# Patient Record
Sex: Female | Born: 1965
Health system: Southern US, Community
[De-identification: ages and names within clinical notes are randomized; demographics above are authoritative.]

## PROBLEM LIST (undated history)

## (undated) DIAGNOSIS — N3010 Interstitial cystitis (chronic) without hematuria: Secondary | ICD-10-CM

---

## 2006-04-17 ENCOUNTER — Emergency Department (HOSPITAL_COMMUNITY): Admission: EM | Admit: 2006-04-17 | Discharge: 2006-04-18 | Payer: Self-pay | Admitting: Emergency Medicine

## 2007-12-14 IMAGING — CR DG CHEST 2V
2 series · 2 of 2 positions shown · non-contrast
Comparison: none

CLINICAL DATA: Chest pain and shortness of breath.  Smoker.
 CHEST - 2 VIEW:

[w chest pa]
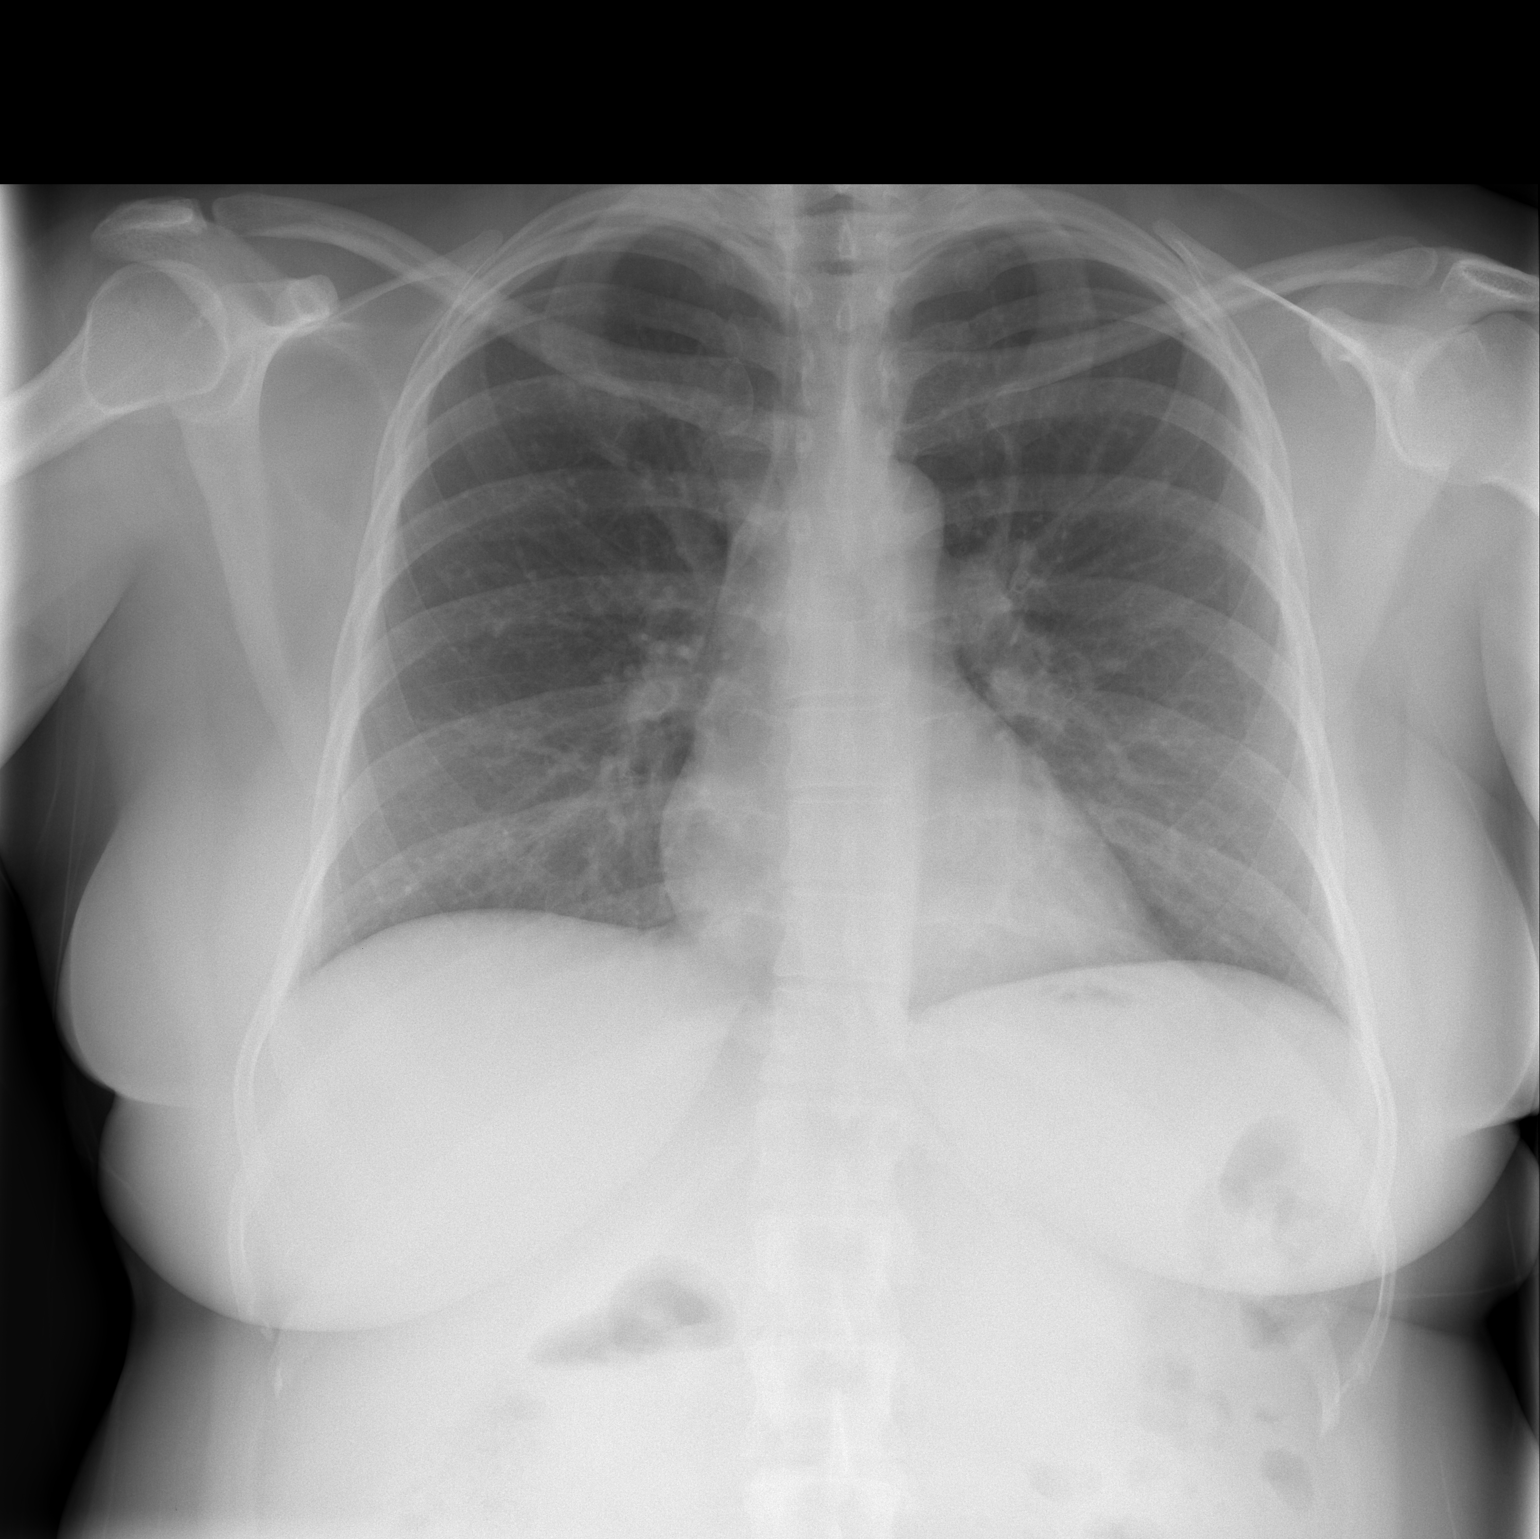

[w chest lat]
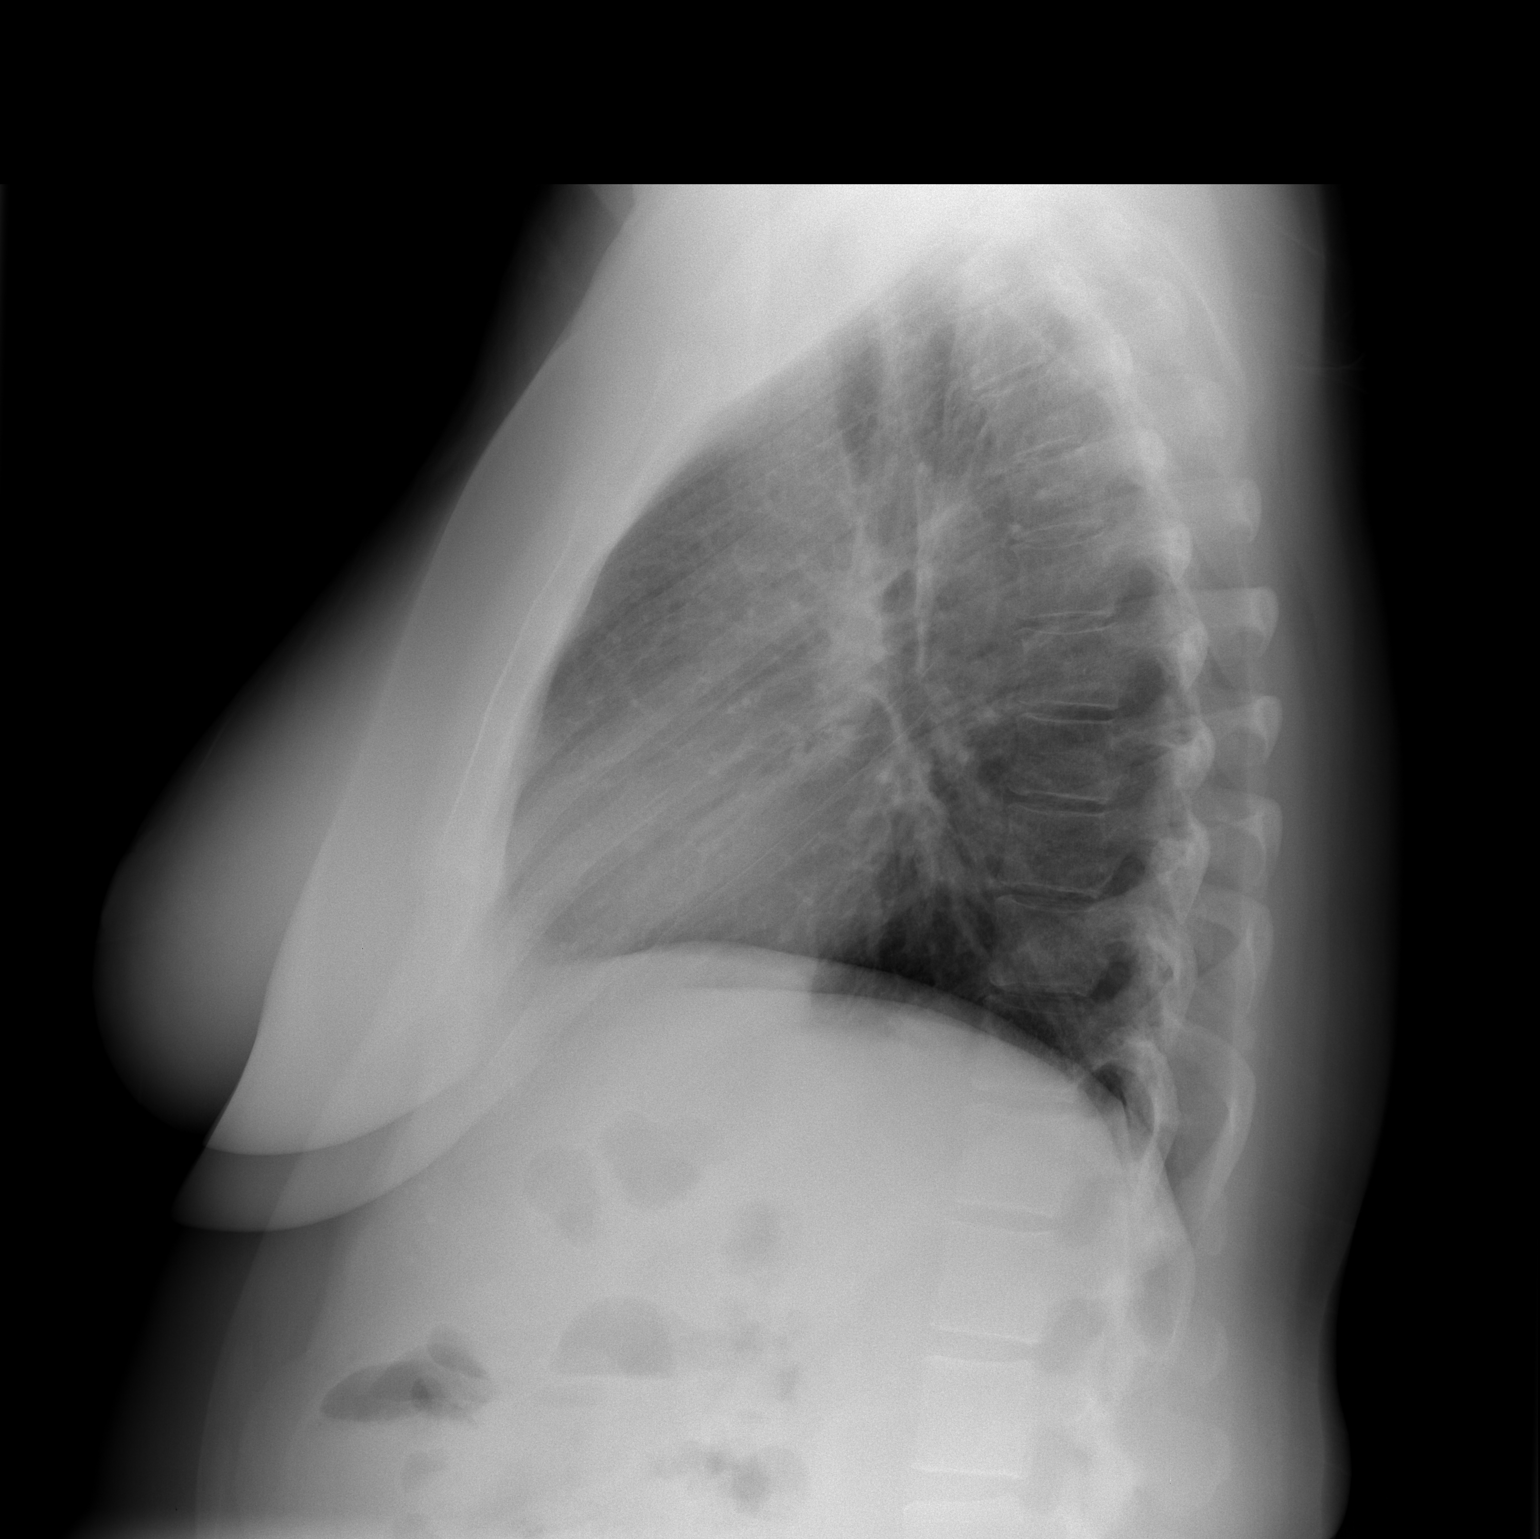

[2 of 2 positions shown; findings below may reference images not displayed]

FINDINGS: The heart size and mediastinal contours are within normal limits.  Both lungs are clear.  The visualized skeletal structures are unremarkable.
IMPRESSION: No active cardiopulmonary disease.

## 2010-01-22 ENCOUNTER — Ambulatory Visit: Payer: Self-pay | Admitting: Emergency Medicine

## 2010-01-22 DIAGNOSIS — N390 Urinary tract infection, site not specified: Secondary | ICD-10-CM

## 2010-01-22 LAB — CONVERTED CEMR LAB
Nitrite: POSITIVE
Protein, U semiquant: 30
Urobilinogen, UA: 0.2
pH: 5.5

## 2010-05-29 ENCOUNTER — Ambulatory Visit: Payer: Self-pay | Admitting: Family Medicine

## 2010-05-29 DIAGNOSIS — L259 Unspecified contact dermatitis, unspecified cause: Secondary | ICD-10-CM

## 2010-05-29 LAB — CONVERTED CEMR LAB
Bilirubin Urine: NEGATIVE
Blood in Urine, dipstick: 2
Urobilinogen, UA: 0.2
pH: 6.5

## 2010-06-01 ENCOUNTER — Telehealth (INDEPENDENT_AMBULATORY_CARE_PROVIDER_SITE_OTHER): Payer: Self-pay | Admitting: *Deleted

## 2010-07-21 NOTE — Assessment & Plan Note (Signed)
Summary: POSSIBLE UTI? NH (rm 3)   Vital Signs:  Patient Profile:   45 Years Old Female CC:      possible UTI Height:     63.5 inches Weight:      175 pounds O2 Sat:      97 % O2 treatment:    Room Air Temp:     99.2 degrees F oral Pulse rate:   83 / minute Resp:     16 per minute BP sitting:   125 / 81  (left arm) Cuff size:   regular  Vitals Entered By: Clemens Catholic LPN (May 29, 2010 11:13 AM)                  Updated Prior Medication List: FISH OIL MAXIMUM STRENGTH 1200 MG CAPS (OMEGA-3 FATTY ACIDS) once daily * VITMAIN C once daily B COMPLEX FORMULA 1  TABS (VITAMINS-LIPOTROPICS) once daily VITAMIN E 16109 UNIT/ML OIL (VITAMIN E) once daily CO Q 10 60 MG CAPS (COENZYME Q10) once daily AZO-STANDARD 95 MG TABS (PHENAZOPYRIDINE HCL)   Current Allergies: ! MORPHINE ! TETRACYCLINE ! * LATEXHistory of Present Illness Chief Complaint: possible UTI History of Present Illness:  Subjective:  Patient complains of awakening this morning 4:30am with dysuria, urgency, frequency.  ? low grade fever.  No abdominal pain.  No nausea/vomiting.  No vaginal discharge or pelvic pain. She also has a history of eczema and requests a refill of triamcinolone ointment for recurrent rash on her hands.  REVIEW OF SYSTEMS Constitutional Symptoms       Complains of fever, chills, and night sweats.     Denies weight loss, weight gain, and fatigue.  Eyes       Denies change in vision, eye pain, eye discharge, glasses, contact lenses, and eye surgery. Ear/Nose/Throat/Mouth       Denies hearing loss/aids, change in hearing, ear pain, ear discharge, dizziness, frequent runny nose, frequent nose bleeds, sinus problems, sore throat, hoarseness, and tooth pain or bleeding.  Respiratory       Denies dry cough, productive cough, wheezing, shortness of breath, asthma, bronchitis, and emphysema/COPD.  Cardiovascular       Denies murmurs, chest pain, and tires easily with exhertion.     Gastrointestinal       Denies stomach pain, nausea/vomiting, diarrhea, constipation, blood in bowel movements, and indigestion. Genitourniary       Complains of painful urination and blood or discharge from vagina.      Denies kidney stones and loss of urinary control.      Comments: milky vaginal d/c Neurological       Denies paralysis, seizures, and fainting/blackouts. Musculoskeletal       Denies muscle pain, joint pain, joint stiffness, decreased range of motion, redness, swelling, muscle weakness, and gout.  Skin       Denies bruising, unusual mles/lumps or sores, and hair/skin or nail changes.  Psych       Denies mood changes, temper/anger issues, anxiety/stress, speech problems, depression, and sleep problems. Other Comments: pt c/o painful urination, lower abd pain, fever, chills x this AM. pt states she took AZO this AM. she also reports a milky vaginal d/c she used an OTC suppository yesterday.    Past History:  Past Medical History: Reviewed history from 01/22/2010 and no changes required. Unremarkable  Past Surgical History: Reviewed history from 01/22/2010 and no changes required. discemtomy x 2 Hysterectomy  Bladder tack lasik  Family History: Reviewed history from 01/22/2010 and no changes  required. Family History Hypertension Family History High cholesterol DM  Social History: Reviewed history from 01/22/2010 and no changes required. Current Smoker 1/2 PPD Alcohol use-yes- occasioanlly Drug use-no   Objective:  No acute distress  Eyes:  Pupils are equal, round, and reactive to light and accomdation.  Extraocular movement is intact.  Conjunctivae are not inflamed.  Mouth:  moist mucous membranes  Neck:  Supple.  No adenopathy is present.  No thyromegaly is present  Lungs:  Clear to auscultation.  Breath sounds are equal.  Heart:  Regular rate and rhythm without murmurs, rubs, or gallops.  Abdomen:  Nontender without masses or hepatosplenomegaly.   Bowel sounds are present.  No CVA or flank tenderness.  Skin:  eczematous appearing patches on hands and forearms. urinalysis (dipstick): leuks 2+; positive nitrite, 2+ blood Assessment  Assessed UTI as deteriorated - Donna Christen MD New Problems: ECZEMA, HANDS (ICD-692.9)   Plan New Medications/Changes: TRIAMCINOLONE ACETONIDE 0.1 % OINT (TRIAMCINOLONE ACETONIDE) Apply to affected area two times a day to three times a day  #30gm x 1, 05/29/2010, Donna Christen MD FLUCONAZOLE 150 MG TABS (FLUCONAZOLE) One by mouth  #1 x 0, 05/29/2010, Donna Christen MD CIPROFLOXACIN HCL 250 MG TABS (CIPROFLOXACIN HCL) 1 bo two times a day  #20 x 0, 05/29/2010, Donna Christen MD  New Orders: Est. Patient Level III [78469] T-Urinalysis Dipstick only [81003QW] Planning Comments:   Patient declines urine culture. Resume Cipro for 3 days with OTC AZO.  Increase fluid intake.  May prophylactically continue a Cipro tab every sexual intercourse Follow-up with urologist if symptoms become more frequent or worsen Rx given for triamcinolone ointment                                                                                             The patient and/or caregiver has been counseled thoroughly with regard to medications prescribed including dosage, schedule, interactions, rationale for use, and possible side effects and they verbalize understanding.  Diagnoses and expected course of recovery discussed and will return if not improved as expected or if the condition worsens. Patient and/or caregiver verbalized understanding.  Prescriptions: TRIAMCINOLONE ACETONIDE 0.1 % OINT (TRIAMCINOLONE ACETONIDE) Apply to affected area two times a day to three times a day  #30gm x 1   Entered and Authorized by:   Donna Christen MD   Signed by:   Donna Christen MD on 05/29/2010   Method used:   Print then Give to Patient   RxID:   [id number] FLUCONAZOLE 150 MG TABS (FLUCONAZOLE) One by mouth  #1 x 0   Entered and  Authorized by:   Donna Christen MD   Signed by:   Donna Christen MD on 05/29/2010   Method used:   Print then Give to Patient   RxID:   [id number] CIPROFLOXACIN HCL 250 MG TABS (CIPROFLOXACIN HCL) 1 bo two times a day  #20 x 0   Entered and Authorized by:   Donna Christen MD   Signed by:   Donna Christen MD on 05/29/2010   Method used:   Print then Give to Patient   RxID:  [id number]   Orders Added: 1)  Est. Patient Level III [99213] 2)  T-Urinalysis Dipstick only [81003QW]    Laboratory Results   Urine Tests  Date/Time Received: May 29, 2010 10:15 AM  Date/Time Reported: May 29, 2010 10:16 AM   Routine Urinalysis   Color: orange Appearance: Clear Glucose: negative   (Normal Range: Negative) Bilirubin: negative   (Normal Range: Negative) Ketone: negative   (Normal Range: Negative) Spec. Gravity: 1.015   (Normal Range: 1.003-1.035) Blood: 2 +   (Normal Range: Negative) pH: 6.5   (Normal Range: 5.0-8.0) Protein: negative   (Normal Range: Negative) Urobilinogen: 0.2   (Normal Range: 0-1) Nitrite: positive   (Normal Range: Negative) Leukocyte Esterace: 2+   (Normal Range: Negative)    Comments: pt is taking AZO OTC

## 2010-07-21 NOTE — Assessment & Plan Note (Signed)
Summary: UTI   Vital Signs:  Patient Profile:   45 Years Old Female CC:      UTI Height:     63.5 inches Weight:      168 pounds O2 Sat:      98 % O2 treatment:    Room Air Temp:     98.0 degrees F oral Pulse rate:   61 / minute Resp:     14 per minute BP sitting:   117 / 85  (right arm) Cuff size:   regular  Vitals Entered By: Lajean Saver RN (January 22, 2010 12:56 PM)                  Updated Prior Medication List: FISH OIL MAXIMUM STRENGTH 1200 MG CAPS (OMEGA-3 FATTY ACIDS) once daily * VITMAIN C once daily B COMPLEX FORMULA 1  TABS (VITAMINS-LIPOTROPICS) once daily VITAMIN E 52841 UNIT/ML OIL (VITAMIN E) once daily CO Q 10 60 MG CAPS (COENZYME Q10) once daily  Current Allergies: ! MORPHINE ! TETRACYCLINE ! * LATEXHistory of Present Illness Chief Complaint: UTI History of Present Illness: UTI symptoms for a few days.  It happens every time she & her boyfriend have sex, which is about once every few months.  She is concerned because it always happens.  She has been using Azo & cranberry which helps a little.  No fever, chills, N/V.  Sexually active with 1 female. +Dysuria, frequency, urgency.  REVIEW OF SYSTEMS Constitutional Symptoms      Denies fever, chills, night sweats, weight loss, weight gain, and fatigue.  Eyes       Denies change in vision, eye pain, eye discharge, glasses, contact lenses, and eye surgery. Ear/Nose/Throat/Mouth       Denies hearing loss/aids, change in hearing, ear pain, ear discharge, dizziness, frequent runny nose, frequent nose bleeds, sinus problems, sore throat, hoarseness, and tooth pain or bleeding.  Respiratory       Denies dry cough, productive cough, wheezing, shortness of breath, asthma, bronchitis, and emphysema/COPD.  Cardiovascular       Denies murmurs, chest pain, and tires easily with exhertion.    Gastrointestinal       Complains of nausea/vomiting.      Denies stomach pain, diarrhea, constipation, blood in bowel  movements, and indigestion.      Comments: nausea Genitourniary       Complains of painful urination.      Denies kidney stones and loss of urinary control.      Comments: hematuria Neurological       Denies paralysis, seizures, and fainting/blackouts. Musculoskeletal       Denies muscle pain, joint pain, joint stiffness, decreased range of motion, redness, swelling, muscle weakness, and gout.  Skin       Denies bruising, unusual mles/lumps or sores, and hair/skin or nail changes.  Psych       Denies mood changes, temper/anger issues, anxiety/stress, speech problems, depression, and sleep problems. Other Comments: taken Azo 2 days ago   Past History:  Past Medical History: Unremarkable  Past Surgical History: discemtomy x 2 Hysterectomy  Bladder tack lasik  Family History: Family History Hypertension Family History High cholesterol DM  Social History: Current Smoker 1/2 PPD Alcohol use-yes- occasioanlly Drug use-no Smoking Status:  current Drug Use:  no Physical Exam General appearance: well developed, well nourished, no acute distress Ears: normal, no lesions or deformities Nasal: mucosa pink, nonedematous, no septal deviation, turbinates normal Oral/Pharynx: tongue normal, posterior pharynx without erythema  or exudate Heart: regular rate and  rhythm, no murmur Abdomen: soft, non-tender without obvious organomegaly Back: no CAT Skin: no obvious rashes or lesions Assessment New Problems: UTI (ICD-599.0)   Patient Education: Hydration, cranberry juice, urinate after intercourse  Plan New Medications/Changes: CIPRO 250 MG TABS (CIPROFLOXACIN HCL) 1 tab by mouth two times a day for 10 days  #20 x 0, 01/22/2010, Hoyt Koch MD FLUCONAZOLE 150 MG TABS (FLUCONAZOLE) 1 tab by mouth x1 dose, may repeat in 2-3 days if symptoms continue  #2 x 0, 01/22/2010, Hoyt Koch MD  New Orders: New Patient Level III [phone] UA Dipstick w/o Micro (automated)   [81003] Planning Comments:   Gave Ph # of PCP to call May need prophylactic ABX due to recurrent symptoms or referral to urology She will wait to see how this treatment goes Diflucan for h/o yeast vaginitis w/ ABX May use Azo x2 days  Follow Up: Follow up with Primary Physician  The patient and/or caregiver has been counseled thoroughly with regard to medications prescribed including dosage, schedule, interactions, rationale for use, and possible side effects and they verbalize understanding.  Diagnoses and expected course of recovery discussed and will return if not improved as expected or if the condition worsens. Patient and/or caregiver verbalized understanding.  Prescriptions: CIPRO 250 MG TABS (CIPROFLOXACIN HCL) 1 tab by mouth two times a day for 10 days  #20 x 0   Entered and Authorized by:   Hoyt Koch MD   Signed by:   Hoyt Koch MD on 01/22/2010   Method used:   Print then Give to Patient   RxID:   [id number] FLUCONAZOLE 150 MG TABS (FLUCONAZOLE) 1 tab by mouth x1 dose, may repeat in 2-3 days if symptoms continue  #2 x 0   Entered and Authorized by:   Hoyt Koch MD   Signed by:   Hoyt Koch MD on 01/22/2010   Method used:   Print then Give to Patient   RxID:   [id number]   Orders Added: 1)  New Patient Level III [57322] 2)  UA Dipstick w/o Micro (automated)  [81003]  Laboratory Results   Urine Tests  Date/Time Received: January 22, 2010 12:57 PM  Date/Time Reported: January 22, 2010 12:57 PM   Routine Urinalysis   Color: orange Appearance: Clear Glucose: 100   (Normal Range: Negative) Bilirubin: negative   (Normal Range: Negative) Ketone: negative   (Normal Range: Negative) Spec. Gravity: <1.005   (Normal Range: 1.003-1.035) Blood: large   (Normal Range: Negative) pH: 5.5   (Normal Range: 5.0-8.0) Protein: 30   (Normal Range: Negative) Urobilinogen: 0.2   (Normal Range: 0-1) Nitrite: positive   (Normal Range:  Negative) Leukocyte Esterace: small   (Normal Range: Negative)

## 2010-07-23 NOTE — Progress Notes (Signed)
  Phone Note Outgoing Call Call back at [facility] Phone [phone]   Call placed by: Emilio Math,  June 01, 2010 9:34 AM Call placed to: Patient Summary of Call: Could not leave a message

## 2016-07-02 DIAGNOSIS — N3010 Interstitial cystitis (chronic) without hematuria: Secondary | ICD-10-CM | POA: Diagnosis not present

## 2016-07-02 DIAGNOSIS — R319 Hematuria, unspecified: Secondary | ICD-10-CM | POA: Diagnosis not present

## 2016-07-02 DIAGNOSIS — N390 Urinary tract infection, site not specified: Secondary | ICD-10-CM | POA: Diagnosis not present

## 2016-08-24 DIAGNOSIS — N390 Urinary tract infection, site not specified: Secondary | ICD-10-CM | POA: Diagnosis not present

## 2017-11-14 ENCOUNTER — Emergency Department
Admission: EM | Admit: 2017-11-14 | Discharge: 2017-11-14 | Disposition: A | Discharge status: Home / Self Care | Payer: BLUE CROSS/BLUE SHIELD | Source: Home / Self Care | Attending: Family Medicine | Admitting: Family Medicine

## 2017-11-14 ENCOUNTER — Other Ambulatory Visit: Payer: Self-pay

## 2017-11-14 DIAGNOSIS — R300 Dysuria: Secondary | ICD-10-CM

## 2017-11-14 DIAGNOSIS — R310 Gross hematuria: Secondary | ICD-10-CM

## 2017-11-14 DIAGNOSIS — N3010 Interstitial cystitis (chronic) without hematuria: Secondary | ICD-10-CM

## 2017-11-14 HISTORY — DX: Interstitial cystitis (chronic) without hematuria: N30.10

## 2017-11-14 LAB — POCT URINALYSIS DIP (MANUAL ENTRY)

## 2017-11-14 MED ORDER — CEPHALEXIN 500 MG PO CAPS: 500.0000 mg | ORAL_CAPSULE | Freq: Two times a day (BID) | ORAL | 0 refills | Status: DC

## 2017-11-14 MED ORDER — OXYBUTYNIN CHLORIDE ER 10 MG PO TB24: 10.0000 mg | ORAL_TABLET | Freq: Every day | ORAL | 0 refills | Status: DC

## 2017-11-14 NOTE — ED Triage Notes (Signed)
Pt has a hx of interstitial cystitis.  She started Friday with genital irritation.  Saturday had pressure and pain, and took monistat and AZO.  Yesterday burning with urination, took Diflucan, and AZo.  This am feels sharp burning pain, and blood in urine.

## 2017-11-14 NOTE — ED Provider Notes (Signed)
[facility]    CSN: [id number] Arrival date & time: 11/14/17  0944     History   Chief Complaint Chief Complaint  Patient presents with  . Urinary Frequency    HPI Vanessa Landry is a 52 y.o. female.   HPI  Vanessa Landry is a 52 y.o. female presenting to UC with c/o 2-3 days of worsening dysuria, urinary frequency, and bladder/pain.  She does have a hx of interstitial cystitis and is usually followed by her PCP but states due to the holiday weekend she was unable to see them today and the pain has become too irritative for pt to wait to f/u with PCP.  She has taken Azo w/o relief.  Denies fever, chills, n/v/d. Pain feels similar to prior flares of her IC.  She notes she typically does well with the oxybutynin and an antibiotic.    Past Medical History:  Diagnosis Date  . Interstitial cystitis     Patient Active Problem List   Diagnosis Date Noted  . ECZEMA, HANDS 05/29/2010  . UTI 01/22/2010    History reviewed. No pertinent surgical history.  OB History   None      Home Medications    Prior to Admission medications   Medication Sig Start Date End Date Taking? Authorizing Provider  cephALEXin (KEFLEX) 500 MG capsule Take 1 capsule (500 mg total) by mouth 2 (two) times daily. 11/14/17   Lurene Shadow, PA-C  oxybutynin (DITROPAN XL) 10 MG 24 hr tablet Take 1 tablet (10 mg total) by mouth at bedtime. 11/14/17   Lurene Shadow, PA-C    Family History History reviewed. No pertinent family history.  Social History Social History   Tobacco Use  . Smoking status: Not on file  Substance Use Topics  . Alcohol use: Not on file  . Drug use: Not on file     Allergies   Latex; Morphine; and Tetracycline   Review of Systems Review of Systems  Constitutional: Negative for chills and fever.  Gastrointestinal: Positive for abdominal pain ( lower bladder pressure). Negative for nausea and vomiting.  Genitourinary: Positive for dysuria, frequency,  hematuria and urgency. Negative for decreased urine volume and pelvic pain.  Musculoskeletal: Negative for back pain and myalgias.     Physical Exam Triage Vital Signs ED Triage Vitals [11/14/17 1023]  Enc Vitals Group     BP 134/88     Pulse Rate 79     Resp      Temp (!) 97.4 F (36.3 C)     Temp Source Oral     SpO2 95 %     Weight 201 lb (91.2 kg)     Height  (1.626 m)     Head Circumference      Peak Flow      Pain Score 8     Pain Loc      Pain Edu?      Excl. in GC?    No data found.  Updated Vital Signs BP 134/88 (BP Location: Right Arm)   Pulse 79   Temp (!) 97.4 F (36.3 C) (Oral)   Ht  (1.626 m)   Wt 201 lb (91.2 kg)   SpO2 95%   BMI 34.50 kg/m   Visual Acuity Right Eye Distance:   Left Eye Distance:   Bilateral Distance:    Right Eye Near:   Left Eye Near:    Bilateral Near:     Physical Exam  Constitutional:  She is oriented to person, place, and time. She appears well-developed and well-nourished. No distress.  HENT:  Head: Normocephalic and atraumatic.  Mouth/Throat: Oropharynx is clear and moist.  Eyes: EOM are normal.  Neck: Normal range of motion. Neck supple.  Cardiovascular: Normal rate and regular rhythm.  Pulmonary/Chest: Effort normal and breath sounds normal. No stridor. No respiratory distress. She has no wheezes. She has no rales.  Abdominal: Soft. She exhibits no distension. There is tenderness ( mild suprapubic tenderness). There is no CVA tenderness.  Musculoskeletal: Normal range of motion.  Neurological: She is alert and oriented to person, place, and time.  Skin: Skin is warm and dry. She is not diaphoretic.  Psychiatric: She has a normal mood and affect. Her behavior is normal.  Nursing note and vitals reviewed.    UC Treatments / Results  Labs (all labs ordered are listed, but only abnormal results are displayed) Labs Reviewed  URINE CULTURE  POCT URINALYSIS DIP (MANUAL ENTRY)     EKG None  Radiology No results found.  Procedures Procedures (including critical care time)  Medications Ordered in UC Medications - No data to display  Initial Impression / Assessment and Plan / UC Course  I have reviewed the triage vital signs and the nursing notes.  Pertinent labs & imaging results that were available during my care of the patient were reviewed by me and considered in my medical decision making (see chart for details).    Unable to perform UA because pt took Azo today. Urine culture sent Medical records reviewed.  Will start on Keflex as it seems she has done well with this in the past.   Final Clinical Impressions(s) / UC Diagnoses   Final diagnoses:  Dysuria  Interstitial cystitis  Hematuria, gross   Discharge Instructions   None    ED Prescriptions    Medication Sig Dispense Auth. Provider   cephALEXin (KEFLEX) 500 MG capsule Take 1 capsule (500 mg total) by mouth 2 (two) times daily. 14 capsule Doroteo Glassman, Isair Inabinet O, PA-C   oxybutynin (DITROPAN XL) 10 MG 24 hr tablet Take 1 tablet (10 mg total) by mouth at bedtime. 5 tablet Lurene Shadow, PA-C     Controlled Substance Prescriptions [place] Controlled Substance Registry consulted? Not Applicable   Rolla Plate 11/14/17 1308

## 2017-11-15 LAB — URINE CULTURE
MICRO NUMBER:: 90639619
Result:: NO GROWTH
SPECIMEN QUALITY:: ADEQUATE

## 2017-11-16 ENCOUNTER — Telehealth: Payer: Self-pay | Admitting: Emergency Medicine

## 2017-12-05 ENCOUNTER — Emergency Department (HOSPITAL_COMMUNITY)
Admission: EM | Admit: 2017-12-05 | Discharge: 2017-12-05 | Disposition: A | Discharge status: Home / Self Care | Payer: BLUE CROSS/BLUE SHIELD | Attending: Emergency Medicine | Admitting: Emergency Medicine

## 2017-12-05 ENCOUNTER — Encounter (HOSPITAL_COMMUNITY): Payer: Self-pay | Admitting: *Deleted

## 2017-12-05 ENCOUNTER — Emergency Department (HOSPITAL_COMMUNITY): Payer: BLUE CROSS/BLUE SHIELD

## 2017-12-05 DIAGNOSIS — R079 Chest pain, unspecified: Secondary | ICD-10-CM | POA: Diagnosis not present

## 2017-12-05 DIAGNOSIS — Z79899 Other long term (current) drug therapy: Secondary | ICD-10-CM | POA: Insufficient documentation

## 2017-12-05 DIAGNOSIS — Z87891 Personal history of nicotine dependence: Secondary | ICD-10-CM | POA: Diagnosis not present

## 2017-12-05 DIAGNOSIS — R002 Palpitations: Secondary | ICD-10-CM | POA: Diagnosis not present

## 2017-12-05 DIAGNOSIS — Z91040 Latex allergy status: Secondary | ICD-10-CM | POA: Diagnosis not present

## 2017-12-05 DIAGNOSIS — R0789 Other chest pain: Secondary | ICD-10-CM

## 2017-12-05 LAB — CBC
HCT: 46.3 % — ABNORMAL HIGH (ref 36.0–46.0)
HEMOGLOBIN: 14.8 g/dL (ref 12.0–15.0)
MCH: 28.1 pg (ref 26.0–34.0)
MCHC: 32 g/dL (ref 30.0–36.0)
MCV: 88 fL (ref 78.0–100.0)
Platelets: 310 10*3/uL (ref 150–400)
RBC: 5.26 MIL/uL — AB (ref 3.87–5.11)
RDW: 14 % (ref 11.5–15.5)
WBC: 7.3 10*3/uL (ref 4.0–10.5)

## 2017-12-05 LAB — I-STAT BETA HCG BLOOD, ED (MC, WL, AP ONLY): HCG, QUANTITATIVE: 6.1 m[IU]/mL — AB (ref <5)

## 2017-12-05 LAB — BASIC METABOLIC PANEL
ANION GAP: 12 (ref 5–15)
BUN: 16 mg/dL (ref 6–20)
CO2: 26 mmol/L (ref 22–32)
Calcium: 9.8 mg/dL (ref 8.9–10.3)
Chloride: 104 mmol/L (ref 101–111)
Creatinine, Ser: 1.01 mg/dL — ABNORMAL HIGH (ref 0.44–1.00)
GLUCOSE: 108 mg/dL — AB (ref 65–99)
Potassium: 4.1 mmol/L (ref 3.5–5.1)
SODIUM: 142 mmol/L (ref 135–145)

## 2017-12-05 LAB — I-STAT TROPONIN, ED
Troponin i, poc: 0 ng/mL (ref 0.00–0.08)
Troponin i, poc: 0 ng/mL (ref 0.00–0.08)

## 2017-12-05 NOTE — ED Notes (Signed)
Pt verbalizes understanding of d/c instructions. Pt ambulatory at d/c with all belongings and with family.   

## 2017-12-05 NOTE — ED Provider Notes (Signed)
[facility] EMERGENCY [facility] Provider Note   CSN: [id number] Arrival date & time: 12/05/17  1104     History   Chief Complaint Chief Complaint  Patient presents with  . Chest Pain  . Palpitations    HPI Vanessa Landry is a 52 y.o. female with a pmhx of interstitial cystitis, anxiety who presented to the ED today complaining of chest pain. Pt reports taht for the past few days she has felt episodes of "chest dipping". She described this sensation as similar to being on a roller coaster but "in my chest instead of my stomach". This morning she was in a meeting when she experienced left upp anterior chest pain that radiated into her left should and left neck. SHe had slight tingling in her left fingertips and some mild shortness of breath, nausea. Denies any vomiting, diaphoresis. Pain described as achy and is reproducible on exam. Pain lasted approximately 20 minutes before resolving. She still reports some lingering soreness in her left shoulder. She reports taht she had a similar episode in 2016 at which time she had a normal EKG, troponin of 0.1. She underwent heart catheterization which was normal and was discharged from cardiology service. She also had echo at that time that revealed normal LV function with 1-2+ mitral insufficiency, but no pericardial effusion Pt does not smoke.      HPI  Past Medical History:  Diagnosis Date  . Interstitial cystitis     Patient Active Problem List   Diagnosis Date Noted  . ECZEMA, HANDS 05/29/2010  . UTI 01/22/2010    History reviewed. No pertinent surgical history.   OB History   None      Home Medications    Prior to Admission medications   Medication Sig Start Date End Date Taking? Authorizing Provider  cephALEXin (KEFLEX) 500 MG capsule Take 1 capsule (500 mg total) by mouth 2 (two) times daily. 11/14/17   Lurene ShadowPhelps, Erin O, PA-C  oxybutynin (DITROPAN XL) 10 MG 24 hr tablet Take 1 tablet (10 mg total) by  mouth at bedtime. 11/14/17   Lurene ShadowPhelps, Erin O, PA-C    Family History History reviewed. No pertinent family history.  Social History Social History   Tobacco Use  . Smoking status: Former Games developermoker  . Smokeless tobacco: Current User  Substance Use Topics  . Alcohol use: Not on file  . Drug use: Not on file     Allergies   Latex; Morphine; and Tetracycline   Review of Systems Review of Systems  All other systems reviewed and are negative.    Physical Exam Updated Vital Signs BP (!) 152/82 (BP Location: Right Arm)   Pulse 76   Temp 98 F (36.7 C) (Oral)   Resp 20   SpO2 100%   Physical Exam  Constitutional: She is oriented to person, place, and time. She appears well-developed and well-nourished. No distress.  HENT:  Head: Normocephalic and atraumatic.  Mouth/Throat: No oropharyngeal exudate.  Eyes: Pupils are equal, round, and reactive to light. Conjunctivae and EOM are normal. Right eye exhibits no discharge. Left eye exhibits no discharge. No scleral icterus.  Cardiovascular: Normal rate, regular rhythm, normal heart sounds and intact distal pulses. Exam reveals no gallop and no friction rub.  No murmur heard. Left anterior chest wall tenderness to palpation.  Pulmonary/Chest: Effort normal and breath sounds normal. No respiratory distress. She has no wheezes. She has no rales. She exhibits no tenderness.  Abdominal: Soft. She exhibits no distension. There is  no tenderness. There is no guarding.  Musculoskeletal: Normal range of motion. She exhibits no edema.       Right lower leg: She exhibits no edema.       Left lower leg: She exhibits no edema.  Neurological: She is alert and oriented to person, place, and time.  Skin: Skin is warm and dry. No rash noted. She is not diaphoretic. No erythema. No pallor.  Psychiatric: She has a normal mood and affect. Her behavior is normal.  Nursing note and vitals reviewed.    ED Treatments / Results  Labs (all labs ordered  are listed, but only abnormal results are displayed) Labs Reviewed  BASIC METABOLIC PANEL  CBC  I-STAT TROPONIN, ED  I-STAT BETA HCG BLOOD, ED ([facility], [facility], [facility] ONLY)    EKG None     Radiology Dg Chest 2 View  Result Date: 12/05/2017 CLINICAL DATA:  Chest pain EXAM: CHEST - 2 VIEW COMPARISON:  04/17/2006 FINDINGS: The heart size and mediastinal contours are within normal limits. Both lungs are clear. The visualized skeletal structures are unremarkable. IMPRESSION: No active cardiopulmonary disease. Electronically Signed   By: Charlett Nose M.D.   On: 12/05/2017 11:41    Procedures Procedures (including critical care time)  Medications Ordered in ED Medications - No data to display   Initial Impression / Assessment and Plan / ED Course  I have reviewed the triage vital signs and the nursing notes.  Pertinent labs & imaging results that were available during my care of the patient were reviewed by me and considered in my medical decision making (see chart for details).     51 year old F presented to the ED today complaining of acute onset left anterior chest pain radiating to shoulder and left neck with associated nausea, and tingling in left finger tips. Symptoms last 20 minutes before resolving. She took 324 mg ASA en route to ED. ON arrival to the ED, vitals are stable. Pt appears well. She tells me that she is very stressed out and is tearful on exam.EKG unremarkable. Troponin 0.0 and deltra troponin also 0.0. Chest pain is not likely of cardiac or pulmonary etiology d/t presentation, perc negative, VSS, no tracheal deviation, no JVD or new murmur, RRR, breath sounds equal bilaterally, EKG without acute abnormalities, negative troponin, and negative CXR. I think that her symptoms are more related to anxiety/stress induced. Pt has been advised to return to the ED is CP becomes exertional, associated with diaphoresis or nausea, radiates to left jaw/arm, worsens or becomes concerning in any  way. She has requested to follow up with cardiology so I will give referral to [place] cards. She has had some intermittent palpitations so may warrant holter monitor. Return precautions outlined in patient discharge instructions.     Final Clinical Impressions(s) / ED Diagnoses   Final diagnoses:  None    ED Discharge Orders    None       Jadon Harbaugh, Lester Kinsman, PA-C 12/05/17 1611    Gerhard Munch, MD 12/05/17 1624

## 2017-12-05 NOTE — Discharge Instructions (Signed)
Follow up with your primary care provider to discuss these symptoms further. I have also given you a referral to cardiology if symptoms recur or do not improve. Return to the ED if you experience severe worsening of your symptoms, loss of consciousness, difficulty breathing, numbness in an extremity.

## 2017-12-05 NOTE — ED Triage Notes (Signed)
Pt in c/o palpitations over the weekend, states she thought maybe it was anxiety, she just didn't feel well, today symptoms increased and she developed left sided chest pain and nausea, history of same a few years ago and had a negative heart cath at that time, pt alert and oriented, tearful in triage

## 2017-12-29 NOTE — Progress Notes (Addendum)
Cardiology Office Note:    Date:  12/30/2017   ID:  Vanessa Landry, DOB May 12, 1966, MRN [id number]  PCP:  Loyal Jacobson, MD  Cardiologist: Jodelle Red, MD PhD  Referring MD: Loyal Jacobson, MD   Chief Complaint  Patient presents with  . Chest Pain    History of Present Illness:    Vanessa Landry is a 53 y.o. female with a hx of interstitial cystitis and anxiety who is seen as a new consult at the request of Dr. Leonette Most for evaluation of chest pain. She was seen in the ER on 12/05/17 for these symptoms.   Patient concerns: had an episode in 2016 of chest pain, had elevated troponins. Cath and echo at that time were normal. She was never told why her enzymes were elevated, and follow up was not scheduled.  Has been told she has a heart murmur and wore a Holter monitor when she was pregnant.  Symptoms started on June 8 after she flew home to [place] for a weekend. Had an episode of a fast heart rate, watch said her HR was 130. On 6/17, the morning she came to ER, she had severe chest pain, shortness of breath, and diaphoresis. She was under a significant amount of stress, but denies any illness, fevers/chills. Has continued to have these episodes every day, thought less intense. Not related to exertion or movement. Happens several times per day, lasting 5-10 minutes each time. Uses CBD spray and that helps the pain. Not positional, sometimes radiates from mid precordium to left side. Always a tightness or pressure, never sharp. No tenderness. Always happens when she is seated.  Continues to have odd chest sensations. Feels like a fluttering, lasts a few minutes and then goes away. Gets a little light headed but then goes away. Happens every day. 80% of the time it occurs when she is driving. Has some neck tensing with the episodes. Had a similar episode during a massage and the therapist could feel the spasm occurring. She has a smart watch that can run EKGs and notes that they have been  normal during some of these events. Drinks 2 cups of coffee per day.  No syncope, orthopnea, PND. Had one episode of leg swelling but self resolved. Lost 10 lbs recently with increasing activity and drinking more water.  Personal risk factors: no recent lipids (has been told in the past they were fine). On no prescription medications. Never had high blood pressure. Former smoker, quit 5 years ago, 1/3 ppd for about 25 years. Does vape. No alcohol. No illicits. Uses CBD oil spray.  Family history: father had AAA and had graft, has HTN, HLD, and diabetes. Well controlled, now age 53. Mother has glaucoma and has always had low blood pressure. Brother generally healthy. No sudden cardiac death. Had an aunt die of a brain aneursym at age 77 (father's side). Griselda Miner had heart attack and passed away in her 51s. No CVA.   Past Medical History:  Diagnosis Date  . Interstitial cystitis     History reviewed. No pertinent surgical history.  Current Medications: Current Outpatient Medications on File Prior to Visit  Medication Sig  . aspirin EC 81 MG tablet Take 81 mg by mouth daily.  . Cholecalciferol (VITAMIN D3) 5000 units CAPS Take 1 capsule by mouth daily.  . Coenzyme Q10 100 MG TABS Take 100 mg by mouth daily.  Marland Kitchen LYSINE PO Take 1 tablet by mouth daily.  . magnesium gluconate (MAGONATE) 500 MG tablet Take  500 mg by mouth daily.  . Omega-3 Fatty Acids (OMEGA-3 CF PO) Take 4,300 Units by mouth daily.   . Probiotic Product (PROBIOTIC-10) CAPS Take 10 mg by mouth daily.  . vitamin C (ASCORBIC ACID) 500 MG tablet Take 1,000 mg by mouth daily.   No current facility-administered medications on file prior to visit.      Allergies:   Morphine; Tetracycline; and Latex   Social History   Socioeconomic History  . Marital status: Single    Spouse name: Not on file  . Number of children: Not on file  . Years of education: Not on file  . Highest education level: Not on file  Occupational History    . Not on file  Social Needs  . Financial resource strain: Not on file  . Food insecurity:    Worry: Not on file    Inability: Not on file  . Transportation needs:    Medical: Not on file    Non-medical: Not on file  Tobacco Use  . Smoking status: Former Games developermoker  . Smokeless tobacco: Current User  Substance and Sexual Activity  . Alcohol use: Not on file  . Drug use: Not on file  . Sexual activity: Not on file  Lifestyle  . Physical activity:    Days per week: Not on file    Minutes per session: Not on file  . Stress: Not on file  Relationships  . Social connections:    Talks on phone: Not on file    Gets together: Not on file    Attends religious service: Not on file    Active member of club or organization: Not on file    Attends meetings of clubs or organizations: Not on file    Relationship status: Not on file  Other Topics Concern  . Not on file  Social History Narrative  . Not on file     Family History: The patient's family history is not on file. father had AAA and had graft, has HTN, HLD, and diabetes. Well controlled, now age 53. Mother has glaucoma and has always had low blood pressure. Brother generally healthy. No sudden cardiac death. Had an aunt die of a brain aneursym at age 53 (father's side). [name] had heart attack and passed away in her 5360s. No CVA.  ROS:   Please see the history of present illness.  Additional pertinent ROS: Review of Systems  Constitutional: Positive for weight loss. Negative for chills, diaphoresis, fever and malaise/fatigue.  HENT: Negative for ear pain and hearing loss.   Eyes: Negative for blurred vision and pain.  Respiratory: Negative for cough and wheezing.   Cardiovascular: Positive for chest pain, palpitations, orthopnea and leg swelling. Negative for claudication.  Gastrointestinal: Negative for abdominal pain, blood in stool, constipation, diarrhea and melena.  Genitourinary: Positive for hematuria. Negative for  dysuria.       Rare, related to interstitial cystitis  Musculoskeletal: Positive for myalgias and neck pain. Negative for falls.  Skin: Negative for rash.  Neurological: Positive for dizziness. Negative for focal weakness, loss of consciousness and headaches.  Endo/Heme/Allergies: Bruises/bleeds easily.   EKGs/Labs/Other Studies Reviewed:    The following studies were reviewed today: Care Everywhere  cath report from 01/29/2015: 1. Hemodynamics: Aortic pressure 118/63, LVEDP 16    2. Coronary system:Left Main: Large caliber vessel with no angiographic evidence of stenosis.  LAD system: Large caliber vessel, wraps around the apex. No stenosis  LCX system: Left dominant, large caliber vessel.No  stenosis.  RCA system: right none dominant. No stenosis.    Conclusion:  No CAD as mentioned above.  Mildly elevated LVEDP  Preserved LV function with normal wall motion   Echo report from 01/29/2015 Interpretation Summary  A complete portable two-dimensional transthoracic echocardiogram was  performed.  The left ventricle is normal in size, wall thickness and wall motion with  ejection fraction of 55-60%.  The left ventricular diastolic function is normal.  There is mild-moderate (1-2+) mitral regurgitation.  The aortic valve is not well visualized, but is grossly normal.    Left Ventricle  The left ventricle is normal in size, wall thickness and wall motion with  ejection fraction of 55-60%. The left ventricular diastolic function is  normal.      Right Ventricle  The right ventricle is grossly normal in size and function.    Atria  The left and right atria are normal size.    Mitral Valve  The mitral valve is normal in structure and function. There is mild-  moderate (1-2+) mitral regurgitation.      Tricuspid Valve  The tricuspid valve is normal in structure and function. There is no  tricuspid regurgitation.    Aortic  Valve  The aortic valve is not well visualized, but is grossly normal. There is no  aortic regurgitation present.    Pulmonic Valve  The pulmonic valve is normal in structure and function. There is no  pulmonic regurgitation.    Vessels  The aortic root is normal in diameter. The pulmonary artery is not well  visualized.    Pericardium  There is no pericardial effusion.  EKG:  EKG is ordered today.  The ekg ordered today demonstrates normal sinus rhythm  Recent Labs: 12/05/2017: BUN 16; Creatinine, Ser 1.01; Hemoglobin 14.8; Platelets 310; Potassium 4.1; Sodium 142  Recent Lipid Panel No results found for: CHOL, TRIG, HDL, CHOLHDL, VLDL, LDLCALC, LDLDIRECT  Physical Exam:    VS:  BP 108/74   Pulse 69   Ht 5\' 4"  (1.626 m)   Wt 194 lb 12.8 oz (88.4 kg)   BMI 33.44 kg/m     Wt Readings from Last 3 Encounters:  12/30/17 194 lb 12.8 oz (88.4 kg)  11/14/17 201 lb (91.2 kg)     GEN: Well nourished, well developed in no acute distress HEENT: Normal NECK: No JVD; No carotid bruits LYMPHATICS: No lymphadenopathy CARDIAC: regular rhythm, normal S1 and S2, no rubs, gallops. 1/6 systolic murmur. Bilateral DP and Radial pulse 2+ RESPIRATORY:  Clear to auscultation without rales, wheezing or rhonchi  ABDOMEN: Soft, non-tender, non-distended MUSCULOSKELETAL:  No edema; No deformity  SKIN: Warm and dry NEUROLOGIC:  Alert and oriented x 3 PSYCHIATRIC:  Normal affect   ASSESSMENT:    1. Chest pain, unspecified type   2. Counseling on health promotion and disease prevention   3. Palpitation    PLAN:   1. Chest pain: reviewed risk factors. No lipids in the system. Did discuss risk of nicotine and current lack of data on vaping as well as CBD oil. Prior cath and echo in 2016, unremarkable other than mild MR -will order exercise treadmill test.  -ordering lipids to calculate ASCVD. When done with average lipids, ASCVD risk very low -consider A1c at next visit if not  done by PCP -counseled on diet and exercise, working on weight loss and increasing activity level  2. Palpitations: -discussed recommendation for Holter with patient. She would like to wait until after the treadmill test  and then re-evaluate -TSH to rule out hypothyroidism -BMET to rule out electrolyte abnormalities  Plan to follow up in 3 mos, will follow results of ETT.  Addended to add lab results: Tchol 213. TSH normal. BMP largely normal with Cr of 1. Using today's metrics, ASCVD risk score is 1% if considered a former smoker and 3% if considered a current smoker (with vaping). Will use results of treadmill test to further assess risk.  Medication Adjustments/Labs and Tests Ordered: Current medicines are reviewed at length with the patient today.  Concerns regarding medicines are outlined above.  Orders Placed This Encounter  Procedures  . Lipid panel  . Basic metabolic panel  . TSH  . Exercise Tolerance Test  . EKG 12-Lead   No orders of the defined types were placed in this encounter.   Patient Instructions  Medication Instructions: Your physician recommends that you continue on your current medications as directed.    If you need a refill on your cardiac medications before your next appointment, please call your pharmacy.   Labwork: Your physician recommends that you return for lab work in: Today (lipids, BMP, TSH)   Procedures/Testing: Your physician has requested that you have an exercise tolerance test. For further information please visit [email or web address] Please also follow instruction sheet, as given. [address]   Follow-Up: Your physician wants you to follow-up in 3 months with Dr. Cristal Deer.   Special Instructions:    Thank you for choosing Heart[facility] at [facility]!!       Signed, Jodelle Red, MD PhD 12/30/2017 9:49 AM    [facility]

## 2017-12-30 ENCOUNTER — Encounter: Payer: Self-pay | Admitting: Cardiology

## 2017-12-30 ENCOUNTER — Encounter (INDEPENDENT_AMBULATORY_CARE_PROVIDER_SITE_OTHER): Payer: Self-pay

## 2017-12-30 ENCOUNTER — Telehealth (HOSPITAL_COMMUNITY): Payer: Self-pay

## 2017-12-30 ENCOUNTER — Ambulatory Visit: Payer: BLUE CROSS/BLUE SHIELD | Admitting: Cardiology

## 2017-12-30 VITALS — BP 108/74 | HR 69 | Ht 64.0 in | Wt 194.8 lb

## 2017-12-30 DIAGNOSIS — R002 Palpitations: Secondary | ICD-10-CM

## 2017-12-30 DIAGNOSIS — R0789 Other chest pain: Secondary | ICD-10-CM | POA: Insufficient documentation

## 2017-12-30 DIAGNOSIS — R079 Chest pain, unspecified: Secondary | ICD-10-CM | POA: Diagnosis not present

## 2017-12-30 DIAGNOSIS — Z7189 Other specified counseling: Secondary | ICD-10-CM | POA: Diagnosis not present

## 2017-12-30 LAB — BASIC METABOLIC PANEL
BUN/Creatinine Ratio: 13 (ref 9–23)
BUN: 13 mg/dL (ref 6–24)
CO2: 21 mmol/L (ref 20–29)
CREATININE: 1.04 mg/dL — AB (ref 0.57–1.00)
Calcium: 9.6 mg/dL (ref 8.7–10.2)
Chloride: 101 mmol/L (ref 96–106)
GFR calc Af Amer: 71 mL/min/{1.73_m2} (ref >59)
GFR, EST NON AFRICAN AMERICAN: 61 mL/min/{1.73_m2} (ref >59)
Glucose: 97 mg/dL (ref 65–99)
Potassium: 4.6 mmol/L (ref 3.5–5.2)
Sodium: 141 mmol/L (ref 134–144)

## 2017-12-30 LAB — LIPID PANEL
CHOLESTEROL TOTAL: 213 mg/dL — AB (ref 100–199)
Chol/HDL Ratio: 3.7 ratio (ref 0.0–4.4)
HDL: 58 mg/dL (ref >39)
LDL Calculated: 136 mg/dL — ABNORMAL HIGH (ref 0–99)
Triglycerides: 96 mg/dL (ref 0–149)
VLDL Cholesterol Cal: 19 mg/dL (ref 5–40)

## 2017-12-30 LAB — TSH: TSH: 1.4 u[IU]/mL (ref 0.450–4.500)

## 2017-12-30 NOTE — Patient Instructions (Signed)
Medication Instructions: Your physician recommends that you continue on your current medications as directed.    If you need a refill on your cardiac medications before your next appointment, please call your pharmacy.   Labwork: Your physician recommends that you return for lab work in: Today (lipids, BMP, TSH)   Procedures/Testing: Your physician has requested that you have an exercise tolerance test. For further information please visit [email or web address] Please also follow instruction sheet, as given. [address]   Follow-Up: Your physician wants you to follow-up in 3 months with Dr. Cristal Deerhristopher.   Special Instructions:    Thank you for choosing [facility]!!

## 2017-12-30 NOTE — Telephone Encounter (Signed)
Encounter complete. 

## 2018-01-02 ENCOUNTER — Telehealth: Payer: Self-pay

## 2018-01-02 NOTE — Telephone Encounter (Signed)
-----   Message from Jodelle RedBridgette Christopher, MD sent at 12/30/2017  5:46 PM EDT ----- Thyroid is normal, electrolytes are normal, kidney function is unchanged. Cholesterol is high, but even with these numbers it doesn't increased the risk score we discussed. We will see what the treadmill stress test shows and re-evaluate her cardiac risk from there.

## 2018-01-02 NOTE — Telephone Encounter (Signed)
Left message to call back  

## 2018-01-03 ENCOUNTER — Ambulatory Visit (HOSPITAL_COMMUNITY)
Admission: RE | Admit: 2018-01-03 | Discharge: 2018-01-03 | Disposition: A | Discharge status: Home / Self Care | Payer: BLUE CROSS/BLUE SHIELD | Source: Ambulatory Visit | Attending: Cardiovascular Disease | Admitting: Cardiovascular Disease

## 2018-01-03 ENCOUNTER — Telehealth: Payer: Self-pay | Admitting: Cardiology

## 2018-01-03 DIAGNOSIS — R079 Chest pain, unspecified: Secondary | ICD-10-CM

## 2018-01-03 DIAGNOSIS — R002 Palpitations: Secondary | ICD-10-CM

## 2018-01-03 LAB — EXERCISE TOLERANCE TEST
CHL CUP MPHR: 167 {beats}/min
CSEPEDS: 34 s
CSEPHR: 99 %
Estimated workload: 7.8 METS
Exercise duration (min): 6 min
Peak HR: 166 {beats}/min
RPE: 19
Rest HR: 68 {beats}/min

## 2018-01-03 NOTE — Telephone Encounter (Signed)
Spoke with pt and advised of lab results along with Dr. Christopher's recommendation. Pt verbalized understanding. 

## 2018-01-03 NOTE — Telephone Encounter (Signed)
New Message:      Pt is returning a call for lab results. Pt states she is coming in today for a stress test today so she can get the results there.

## 2018-01-03 NOTE — Telephone Encounter (Signed)
Spoke with pt and advised of lab results along with Dr. Di Kindlehristopher's recommendation. Pt verbalized understanding.

## 2018-01-04 ENCOUNTER — Telehealth: Payer: Self-pay

## 2018-01-04 NOTE — Telephone Encounter (Signed)
Updated pt with test results along with Dr. Di Kindlehristopher's recommendation. Pt verbalized understanding but states she hasn't experienced any palpitations since sat and only had about 3 flutters after the stress test. She states she is afraid to pay for holter monitor and nothing shows up since palpitations has subsided. Pt states if Dr. Cristal Deerhristopher feels she need it at this moment she is ok with it, otherwise would like to wait until she start having symptoms again. Routing to MD for recommendations.

## 2018-01-04 NOTE — Telephone Encounter (Signed)
-----   Message from Jodelle RedBridgette Christopher, MD sent at 01/03/2018  3:23 PM EDT ----- We discussed if stress test was normal, we would get a holter monitor for palpitations. If Ms. Vanessa Landry is still amenable to this we can order and have her schedule a time to have it placed. Thank you.

## 2018-01-04 NOTE — Telephone Encounter (Signed)
Spoke with pt and advised that per Dr. Cristal Deerhristopher she is ok to hold off on the monitor for now but to call the office if she starts to experience increased palpations. Pt verbalized understanding.

## 2018-04-11 ENCOUNTER — Ambulatory Visit: Payer: BLUE CROSS/BLUE SHIELD | Admitting: Cardiology

## 2018-04-24 ENCOUNTER — Encounter: Payer: Self-pay | Admitting: Cardiology

## 2018-04-24 ENCOUNTER — Ambulatory Visit: Payer: BLUE CROSS/BLUE SHIELD | Admitting: Cardiology

## 2018-04-24 VITALS — BP 116/78 | HR 83 | Ht 64.0 in | Wt 188.0 lb

## 2018-04-24 DIAGNOSIS — Z6832 Body mass index (BMI) 32.0-32.9, adult: Secondary | ICD-10-CM | POA: Diagnosis not present

## 2018-04-24 DIAGNOSIS — E6609 Other obesity due to excess calories: Secondary | ICD-10-CM

## 2018-04-24 DIAGNOSIS — Z7189 Other specified counseling: Secondary | ICD-10-CM | POA: Diagnosis not present

## 2018-04-24 DIAGNOSIS — R030 Elevated blood-pressure reading, without diagnosis of hypertension: Secondary | ICD-10-CM | POA: Diagnosis not present

## 2018-04-24 NOTE — Patient Instructions (Signed)

## 2018-04-24 NOTE — Progress Notes (Signed)
Cardiology Office Note:    Date:  04/24/2018   ID:  Vanessa Landry, DOB 1965/08/21, MRN [id number]  PCP:  Loyal Jacobson, MD  Cardiologist: Jodelle Red, MD PhD  Referring MD: Loyal Jacobson, MD   CC: follow up  History of Present Illness:    Vanessa Landry is a 52 y.o. female with a hx of interstitial cystitis and anxiety who is seen as a in follow up for evaluation of chest pain. She was seen in the ER on 12/05/17 for these symptoms, and her initial consult was on 12/30/17.   Cardiac history: had an episode in 2016 of chest pain, had elevated troponins. Cath and echo at that time were normal. She was never told why her enzymes were elevated, and follow up was not scheduled. Has been told she has a heart murmur and wore a Holter monitor when she was pregnant. Symptoms started on June 8 after she flew home to [place] for a weekend. Had an episode of a fast heart rate, watch said her HR was 130. Personal risk factors: no recent lipids (has been told in the past they were fine). On no prescription medications. Never had high blood pressure. Former smoker, quit 5 years ago, 1/3 ppd for about 25 years. Does vape. No alcohol. No illicits. Uses CBD oil spray.  Patient concerns today: no further chest pains/palpitations. Continues to have a lot of stress in her life. Hot flashes have improved. BP is elevated today, typically runs 100s systolic. No headaches, occasionally blurry vision 2/2 allergies (doing eye drops). Working on losing weight, cut out fast food completely. Down 6 lbs on our scale, about 10 lbs on her scale. Overall feeling improved.   Past Medical History:  Diagnosis Date  . Interstitial cystitis     No past surgical history on file.  Current Medications: Current Outpatient Medications on File Prior to Visit  Medication Sig  . aspirin EC 81 MG tablet Take 81 mg by mouth daily.  . Beta Glucan POWD by Does not apply route.  . Cholecalciferol (VITAMIN D3) 5000 units CAPS  Take 1 capsule by mouth daily.  . Coenzyme Q10 100 MG TABS Take 100 mg by mouth daily.  Marland Kitchen LYSINE PO Take 1 tablet by mouth daily.  . magnesium gluconate (MAGONATE) 500 MG tablet Take 500 mg by mouth daily.  . Omega-3 Fatty Acids (OMEGA-3 CF PO) Take 4,300 Units by mouth daily.   . Probiotic Product (PROBIOTIC-10) CAPS Take 10 mg by mouth daily.  . vitamin C (ASCORBIC ACID) 500 MG tablet Take 1,000 mg by mouth daily.   No current facility-administered medications on file prior to visit.      Allergies:   Morphine; Tetracycline; and Latex   Social History   Socioeconomic History  . Marital status: Single    Spouse name: Not on file  . Number of children: Not on file  . Years of education: Not on file  . Highest education level: Not on file  Occupational History  . Not on file  Social Needs  . Financial resource strain: Not on file  . Food insecurity:    Worry: Not on file    Inability: Not on file  . Transportation needs:    Medical: Not on file    Non-medical: Not on file  Tobacco Use  . Smoking status: Former Games developer  . Smokeless tobacco: Former Engineer, water and Sexual Activity  . Alcohol use: Not on file  . Drug use: Not on file  .  Sexual activity: Not on file  Lifestyle  . Physical activity:    Days per week: Not on file    Minutes per session: Not on file  . Stress: Not on file  Relationships  . Social connections:    Talks on phone: Not on file    Gets together: Not on file    Attends religious service: Not on file    Active member of club or organization: Not on file    Attends meetings of clubs or organizations: Not on file    Relationship status: Not on file  Other Topics Concern  . Not on file  Social History Narrative  . Not on file     Family History: Father had AAA and had graft, has HTN, HLD, and diabetes. Well controlled, now age 15. Mother has glaucoma and has always had low blood pressure. Brother generally healthy. No sudden cardiac death.  Had an aunt die of a brain aneursym at age 81 (father's side). Griselda Miner had heart attack and passed away in her 56s. No CVA.  ROS:   Please see the history of present illness.  Additional pertinent ROS: Review of Systems  Constitutional: Positive for weight loss. Negative for chills, diaphoresis, fever and malaise/fatigue.  HENT: Negative for ear pain and hearing loss.   Eyes: Negative for blurred vision and pain.  Respiratory: Negative for cough and wheezing.   Cardiovascular: Negative for chest pain, palpitations, orthopnea, claudication and leg swelling.  Gastrointestinal: Negative for abdominal pain, blood in stool, constipation, diarrhea and melena.  Genitourinary: Positive for hematuria. Negative for dysuria.       Rare, related to interstitial cystitis  Musculoskeletal: Positive for myalgias. Negative for falls and neck pain.  Skin: Negative for rash.  Neurological: Positive for dizziness. Negative for focal weakness, loss of consciousness and headaches.  Endo/Heme/Allergies: Bruises/bleeds easily.   EKGs/Labs/Other Studies Reviewed:    The following studies were reviewed today: ETT 01/10/18  Blood pressure demonstrated a normal response to exercise.  There was no ST segment deviation noted during stress.   ETT with fair exercise tolerance (6:34); no chest pain; normal BP response; no ST changes; negative adequate ETT; Duke treadmill score 6.  Care Everywhere  cath report from 01/29/2015: 1. Hemodynamics: Aortic pressure 118/63, LVEDP 16    2. Coronary system:Left Main: Large caliber vessel with no angiographic evidence of stenosis.  LAD system: Large caliber vessel, wraps around the apex. No stenosis  LCX system: Left dominant, large caliber vessel.No stenosis.  RCA system: right none dominant. No stenosis.    Conclusion:  No CAD as mentioned above.  Mildly elevated LVEDP  Preserved LV function with normal wall motion   Echo report from  01/29/2015 Interpretation Summary  A complete portable two-dimensional transthoracic echocardiogram was  performed.  The left ventricle is normal in size, wall thickness and wall motion with  ejection fraction of 55-60%.  The left ventricular diastolic function is normal.  There is mild-moderate (1-2+) mitral regurgitation.  The aortic valve is not well visualized, but is grossly normal.    Left Ventricle  The left ventricle is normal in size, wall thickness and wall motion with  ejection fraction of 55-60%. The left ventricular diastolic function is  normal.      Right Ventricle  The right ventricle is grossly normal in size and function.    Atria  The left and right atria are normal size.    Mitral Valve  The mitral valve is normal in structure  and function. There is mild-  moderate (1-2+) mitral regurgitation.      Tricuspid Valve  The tricuspid valve is normal in structure and function. There is no  tricuspid regurgitation.    Aortic Valve  The aortic valve is not well visualized, but is grossly normal. There is no  aortic regurgitation present.    Pulmonic Valve  The pulmonic valve is normal in structure and function. There is no  pulmonic regurgitation.    Vessels  The aortic root is normal in diameter. The pulmonary artery is not well  visualized.    Pericardium  There is no pericardial effusion.  EKG:  The ekg ordered previously demonstrates normal sinus rhythm  Recent Labs: 12/05/2017: Hemoglobin 14.8; Platelets 310 12/30/2017: BUN 13; Creatinine, Ser 1.04; Potassium 4.6; Sodium 141; TSH 1.400  Recent Lipid Panel    Component Value Date/Time   CHOL 213 (H) 12/30/2017 0912   TRIG 96 12/30/2017 0912   HDL 58 12/30/2017 0912   CHOLHDL 3.7 12/30/2017 0912   LDLCALC 136 (H) 12/30/2017 0912    Physical Exam:    VS:  BP 116/78 (BP Location: Left Arm, Patient Position: Sitting, Cuff Size: Normal)   Pulse 83   Ht  5\' 4"  (1.626 m)   Wt 188 lb (85.3 kg)   BMI 32.27 kg/m     Wt Readings from Last 3 Encounters:  04/24/18 188 lb (85.3 kg)  12/30/17 194 lb 12.8 oz (88.4 kg)  11/14/17 201 lb (91.2 kg)     GEN: Well nourished, well developed in no acute distress HEENT: Normal NECK: No JVD; No carotid bruits LYMPHATICS: No lymphadenopathy CARDIAC: regular rhythm, normal S1 and S2, no rubs, gallops. 1/6 systolic murmur. Bilateral DP and Radial pulse 2+ RESPIRATORY:  Clear to auscultation without rales, wheezing or rhonchi  ABDOMEN: Soft, non-tender, non-distended MUSCULOSKELETAL:  No edema; No deformity  SKIN: Warm and dry NEUROLOGIC:  Alert and oriented x 3 PSYCHIATRIC:  Normal affect   ASSESSMENT:    1. Counseling on health promotion and disease prevention   2. Elevated blood pressure reading in office without diagnosis of hypertension   3. Class 1 obesity due to excess calories without serious comorbidity with body mass index (BMI) of 32.0 to 32.9 in adult    PLAN:   Chest pain, palpitations: resolved. ETT negative adequate.  -no further cardiac workup -counseled on diet and exercise, working on weight loss and increasing activity level  Elevated blood pressure on arrival, improved on recheck -no meds for hypertension. Continue to monitor through PCP; she may develop hypertension in the future but does not require treatment now  Prevention: -recommend heart healthy/Mediterranean diet, with whole grains, fruits, vegetable, fish, lean meats, nuts, and olive oil. Limit salt. -recommend moderate walking, 3-5 times/week for 30-50 minutes each session. Aim for at least 150 minutes.week. Goal should be pace of 3 miles/hours, or walking 1.5 miles in 30 minutes -recommend avoidance of tobacco products. Avoid excess alcohol. -Additional risk factor control:  -Diabetes: A1c is not available.  -Lipids: LDL 136. ASCVD risk low, but may rise with age/if HTN or DM develops. Follow.  -Blood pressure  control: as above  -Weight: BMI 32.3. Working on lifestyle for weight loss.  -ASCVD risk score: 1% (former smoker), 3% (current smoker) if vaping is counted.  Plan to follow up in 1 year  Medication Adjustments/Labs and Tests Ordered: Current medicines are reviewed at length with the patient today.  Concerns regarding medicines are outlined above.  No orders  of the defined types were placed in this encounter.  No orders of the defined types were placed in this encounter.   Patient Instructions  Medication Instructions:  Your Physician recommend you continue on your current medication as directed.    If you need a refill on your cardiac medications before your next appointment, please call your pharmacy.   Lab work: None   Testing/Procedures: None  Follow-Up: At [facility], you and your health needs are our priority.  As part of our continuing mission to provide you with exceptional heart care, we have created designated Provider Care Teams.  These Care Teams include your primary Cardiologist (physician) and Advanced Practice Providers (APPs -  Physician Assistants and Nurse Practitioners) who all work together to provide you with the care you need, when you need it. You will need a follow up appointment in 1 years.  Please call our office 2 months in advance to schedule this appointment.  You may see Jodelle Red, MD or one of the following Advanced Practice Providers on your designated Care Team:   Theodore Demark, PA-C . Joni Reining, DNP, ANP  Any Other Special Instructions Will Be Listed Below (If Applicable).       Signed, Jodelle Red, MD PhD 04/24/2018 10:18 PM    [facility]

## 2018-04-25 ENCOUNTER — Encounter: Payer: Self-pay | Admitting: Cardiology

## 2018-04-25 DIAGNOSIS — R030 Elevated blood-pressure reading, without diagnosis of hypertension: Secondary | ICD-10-CM | POA: Insufficient documentation

## 2019-05-08 ENCOUNTER — Telehealth: Payer: Self-pay | Admitting: *Deleted

## 2019-05-08 NOTE — Telephone Encounter (Signed)
A message was left, re: her follow up visit. 

## 2019-07-14 ENCOUNTER — Emergency Department
Admission: EM | Admit: 2019-07-14 | Discharge: 2019-07-14 | Disposition: A | Discharge status: Home / Self Care | Payer: 59 | Source: Home / Self Care

## 2019-07-14 ENCOUNTER — Encounter: Payer: Self-pay | Admitting: Emergency Medicine

## 2019-07-14 ENCOUNTER — Other Ambulatory Visit: Payer: Self-pay

## 2019-07-14 DIAGNOSIS — N390 Urinary tract infection, site not specified: Secondary | ICD-10-CM

## 2019-07-14 DIAGNOSIS — N3001 Acute cystitis with hematuria: Secondary | ICD-10-CM | POA: Diagnosis not present

## 2019-07-14 DIAGNOSIS — R319 Hematuria, unspecified: Secondary | ICD-10-CM | POA: Diagnosis not present

## 2019-07-14 DIAGNOSIS — L309 Dermatitis, unspecified: Secondary | ICD-10-CM

## 2019-07-14 DIAGNOSIS — Z760 Encounter for issue of repeat prescription: Secondary | ICD-10-CM

## 2019-07-14 LAB — POCT URINALYSIS DIP (MANUAL ENTRY)
Glucose, UA: NEGATIVE mg/dL
Nitrite, UA: POSITIVE — AB
Protein Ur, POC: >=300 mg/dL — AB
Spec Grav, UA: 1.025 (ref 1.010–1.025)
Urobilinogen, UA: 1 E.U./dL
pH, UA: 6.5 (ref 5.0–8.0)

## 2019-07-14 MED ORDER — PHENAZOPYRIDINE HCL 200 MG PO TABS: 200.0000 mg | ORAL_TABLET | Freq: Three times a day (TID) | ORAL | 0 refills | Status: DC | PRN

## 2019-07-14 MED ORDER — CEPHALEXIN 500 MG PO CAPS: 500.0000 mg | ORAL_CAPSULE | Freq: Two times a day (BID) | ORAL | 0 refills | Status: DC

## 2019-07-14 MED ORDER — IBUPROFEN 600 MG PO TABS: 600.0000 mg | ORAL_TABLET | Freq: Four times a day (QID) | ORAL | 0 refills | Status: DC | PRN

## 2019-07-14 MED ORDER — FLUCONAZOLE 150 MG PO TABS: 150.0000 mg | ORAL_TABLET | Freq: Once | ORAL | 1 refills | Status: AC

## 2019-07-14 MED ORDER — BETAMETHASONE DIPROPIONATE 0.05 % EX CREA: TOPICAL_CREAM | Freq: Two times a day (BID) | CUTANEOUS | 0 refills | Status: DC

## 2019-07-14 NOTE — ED Triage Notes (Signed)
Pt has hx of IC. States she developed pelvic pressure and dysuria x2 days ago. Hematuria last night.

## 2019-07-14 NOTE — ED Provider Notes (Signed)
HPI  SUBJECTIVE:  Vanessa Landry is a 54 y.o. female who presents with 2 days of dysuria, urinary urgency, frequency, cloudy odorous urine.  Reports lower abdominal pressure.  Reports hematuria today, states that she started passing clots.  She reports labial / vaginal burning which she attributes to Azo.  No body aches, headaches , nausea, vomiting, fevers, back, flank pain.  No Vaginal itching, genital rash, discharge, odor, bleeding.  States that she has not been sexually active in 6 months.  No antipyretic in the past 4 to 6 hours.  No antibiotics recently.  She has been taking Azo, pushing fluids, Tylenol and oxybutynin without improvement in her symptoms.  Symptoms worse with urination.  She has a past medical history of UTI, interstitial cystitis, yeast infections.  No history of pyelonephritis, nephrolithiasis, gonorrhea, chlamydia, HIV, HSV, syphilis, trichomonas, BV, diabetes.  LMP: Status post hysterectomy.  PMD: Marice Potter, nurse practitioner at [facility] in [place].  Neurology: Dr. Malen Gauze.    Past Medical History:  Diagnosis Date  . Interstitial cystitis     History reviewed. No pertinent surgical history.  History reviewed. No pertinent family history.  Social History   Tobacco Use  . Smoking status: Former Smoker    Types: Cigarettes  . Smokeless tobacco: Former Engineer, water Use Topics  . Alcohol use: Not on file  . Drug use: Not on file    No current facility-administered medications for this encounter.  Current Outpatient Medications:  .  aspirin EC 81 MG tablet, Take 81 mg by mouth daily., Disp: , Rfl:  .  Beta Glucan POWD, by Does not apply route., Disp: , Rfl:  .  betamethasone dipropionate 0.05 % cream, Apply topically 2 (two) times daily., Disp: 30 g, Rfl: 0 .  cephALEXin (KEFLEX) 500 MG capsule, Take 1 capsule (500 mg total) by mouth 2 (two) times daily., Disp: 14 capsule, Rfl: 0 .  Cholecalciferol (VITAMIN D3) 5000 units CAPS, Take 1 capsule by  mouth daily., Disp: , Rfl:  .  Coenzyme Q10 100 MG TABS, Take 100 mg by mouth daily., Disp: , Rfl:  .  fluconazole (DIFLUCAN) 150 MG tablet, Take 1 tablet (150 mg total) by mouth once for 1 dose. 1 tab po x 1. May repeat in 72 hours if no improvement, Disp: 2 tablet, Rfl: 1 .  ibuprofen (ADVIL) 600 MG tablet, Take 1 tablet (600 mg total) by mouth every 6 (six) hours as needed., Disp: 30 tablet, Rfl: 0 .  LYSINE PO, Take 1 tablet by mouth daily., Disp: , Rfl:  .  magnesium gluconate (MAGONATE) 500 MG tablet, Take 500 mg by mouth daily., Disp: , Rfl:  .  Omega-3 Fatty Acids (OMEGA-3 CF PO), Take 4,300 Units by mouth daily. , Disp: , Rfl:  .  phenazopyridine (PYRIDIUM) 200 MG tablet, Take 1 tablet (200 mg total) by mouth 3 (three) times daily as needed for pain., Disp: 6 tablet, Rfl: 0 .  Probiotic Product (PROBIOTIC-10) CAPS, Take 10 mg by mouth daily., Disp: , Rfl:  .  vitamin C (ASCORBIC ACID) 500 MG tablet, Take 1,000 mg by mouth daily., Disp: , Rfl:   Allergies  Allergen Reactions  . Morphine Nausea And Vomiting  . Tetracycline Nausea And Vomiting  . Latex Hives and Rash     ROS  As noted in HPI.   Physical Exam  BP (!) 169/93 (BP Location: Right Arm)   Pulse 90   Temp 98.7 F (37.1 C) (Oral)   SpO2 98%  Constitutional: Well developed, well nourished, no acute distress Eyes:  EOMI, conjunctiva normal bilaterally HENT: Normocephalic, atraumatic,mucus membranes moist Respiratory: Normal inspiratory effort Cardiovascular: Normal rate GI: nondistended soft.  Positive suprapubic tenderness.  No flank tenderness Back: No CVAT skin:  skin intact Musculoskeletal: no deformities Neurologic: Alert & oriented x 3, no focal neuro deficits Psychiatric: Speech and behavior appropriate   ED Course   Medications - No data to display  Orders Placed This Encounter  Procedures  . Urine culture    Standing Status:   Standing    Number of Occurrences:   1    Order Specific  Question:   Patient immune status    Answer:   Normal  . POCT urinalysis dipstick (new)    Standing Status:   Standing    Number of Occurrences:   1    Results for orders placed or performed during the hospital encounter of 07/14/19 (from the past 24 hour(s))  POCT urinalysis dipstick (new)     Status: Abnormal   Collection Time: 07/14/19  4:08 PM  Result Value Ref Range   Color, UA other (A) yellow   Clarity, UA cloudy (A) clear   Glucose, UA negative negative mg/dL   Bilirubin, UA small (A) negative   Ketones, POC UA trace (5) (A) negative mg/dL   Spec Grav, UA 1.025 1.010 - 1.025   Blood, UA large (A) negative   pH, UA 6.5 5.0 - 8.0   Protein Ur, POC >=300 (A) negative mg/dL   Urobilinogen, UA 1.0 0.2 or 1.0 E.U./dL   Nitrite, UA Positive (A) Negative   Leukocytes, UA Small (1+) (A) Negative   No results found.  ED Clinical Impression  1. Urinary tract infection with hematuria, site unspecified   2. Acute cystitis with hematuria   3. Eczema, unspecified type   4. Medication refill      ED Assessment/Plan  UA consistent with UTI.  Urine culture sent.  Home with Keflex, Pyridium, ibuprofen 600 mg combined with 1 g of Tylenol 3-4 times a day.  Diflucan as she states she gets frequent yeast infections post antibiotics.  Follow Up with PMD as needed.  To the ER if she gets worse  Patient also requesting refill of her betamethasone cream for eczema on her hands  Discussed labs,  MDM, treatment plan, and plan for follow-up with patient. Discussed sn/sx that should prompt return to the ED. patient agrees with plan.   Meds ordered this encounter  Medications  . betamethasone dipropionate 0.05 % cream    Sig: Apply topically 2 (two) times daily.    Dispense:  30 g    Refill:  0  . cephALEXin (KEFLEX) 500 MG capsule    Sig: Take 1 capsule (500 mg total) by mouth 2 (two) times daily.    Dispense:  14 capsule    Refill:  0  . phenazopyridine (PYRIDIUM) 200 MG tablet     Sig: Take 1 tablet (200 mg total) by mouth 3 (three) times daily as needed for pain.    Dispense:  6 tablet    Refill:  0  . fluconazole (DIFLUCAN) 150 MG tablet    Sig: Take 1 tablet (150 mg total) by mouth once for 1 dose. 1 tab po x 1. May repeat in 72 hours if no improvement    Dispense:  2 tablet    Refill:  1  . ibuprofen (ADVIL) 600 MG tablet    Sig: Take 1 tablet (600 mg  total) by mouth every 6 (six) hours as needed.    Dispense:  30 tablet    Refill:  0    *This clinic note was created using Scientist, clinical (histocompatibility and immunogenetics). Therefore, there may be occasional mistakes despite careful proofreading.   ?    Domenick Gong, MD 07/15/19 [phone]

## 2019-07-14 NOTE — Discharge Instructions (Addendum)
Continue pushing plenty of fluids.  Take 600 mg of ibuprofen combined with 1 g of Tylenol together 3 or 4 times a day as needed for pain.  Pyridium should help with your urinary symptoms and the Keflex will cure the urinary tract infection.  I have sent your urine off for culture to make sure that you are on the correct antibiotic.  We will call you and change your antibiotic if necessary.

## 2019-07-18 LAB — URINE CULTURE
MICRO NUMBER:: 10076235
SPECIMEN QUALITY:: ADEQUATE

## 2019-08-31 ENCOUNTER — Telehealth (INDEPENDENT_AMBULATORY_CARE_PROVIDER_SITE_OTHER): Payer: 59 | Admitting: Cardiology

## 2019-08-31 VITALS — Ht 64.0 in | Wt 194.0 lb

## 2019-08-31 DIAGNOSIS — Z7182 Exercise counseling: Secondary | ICD-10-CM | POA: Diagnosis not present

## 2019-08-31 DIAGNOSIS — Z7189 Other specified counseling: Secondary | ICD-10-CM

## 2019-08-31 DIAGNOSIS — Z713 Dietary counseling and surveillance: Secondary | ICD-10-CM

## 2019-08-31 DIAGNOSIS — E6609 Other obesity due to excess calories: Secondary | ICD-10-CM

## 2019-08-31 DIAGNOSIS — Z6832 Body mass index (BMI) 32.0-32.9, adult: Secondary | ICD-10-CM

## 2019-08-31 NOTE — Patient Instructions (Signed)

## 2019-08-31 NOTE — Progress Notes (Signed)
Virtual Visit via Video Note   This visit type was conducted due to national recommendations for restrictions regarding the COVID-19 Pandemic (e.g. social distancing) in an effort to limit this patient's exposure and mitigate transmission in our community.  Due to her co-morbid illnesses, this patient is at least at moderate risk for complications without adequate follow up.  This format is felt to be most appropriate for this patient at this time.  All issues noted in this document were discussed and addressed.  A limited physical exam was performed with this format.  Please refer to the patient's chart for her consent to telehealth for [facility].   The patient was identified using 2 identifiers.  Date:  08/31/2019   ID:  Vanessa Landry, DOB 1966/03/26, MRN [id number]  Patient Location: Home Provider Location: Home  PCP:  Jefm Petty, MD  Cardiologist:  Buford Dresser, MD  Electrophysiologist:  None   Evaluation Performed:  Follow-Up Visit  Chief Complaint:  Follow up  History of Present Illness:    Vanessa Landry is a 54 y.o. female with with a hx of interstitial cystitis and anxiety who is seen as a in follow up for evaluation of chest pain. She was seen in the ER on 12/05/17 for these symptoms, and her initial consult was on 12/30/17.   The patient does not have symptoms concerning for COVID-19 infection (fever, chills, cough, or new shortness of breath).   She is a closing agent, has worked very hard during the pandemic, lots of stress. Has remained safe from Covid. No chest pain of palpitations despite stress. Starting working with nutritionist, following with [facility] for hormone treatment. A1c was 6.8 in May of last year, started working with nutritionist, now 5.4 with food changes. Eating lots of vegetables. Weight has been up and down. Is not routinely active. Blood pressure also monitored, has been 110/70s on average. Has has much more energy, mental clarity with  recent treatments. They are small pellets inserted under the skin  Denies chest pain, shortness of breath at rest or with normal exertion. No PND, orthopnea, LE edema or unexpected weight gain. No syncope or palpitations.   Past Medical History:  Diagnosis Date  . Interstitial cystitis    No past surgical history on file.   Current Meds  Medication Sig  . APPLE CIDER VINEGAR PO Take by mouth daily.  . Beta Glucan POWD by Does not apply route.  . betamethasone dipropionate 0.05 % cream Apply topically 2 (two) times daily as needed.  . Cholecalciferol (VITAMIN D3) 5000 units CAPS Take 1 capsule by mouth daily.  . Coenzyme Q10 100 MG TABS Take 100 mg by mouth daily.  Marland Kitchen ibuprofen (ADVIL) 600 MG tablet Take 1 tablet (600 mg total) by mouth every 6 (six) hours as needed.  Marland Kitchen LYSINE PO Take 1 tablet by mouth daily.  . magnesium gluconate (MAGONATE) 500 MG tablet Take 500 mg by mouth daily.  . Omega-3 Fatty Acids (OMEGA-3 CF PO) Take 4,300 Units by mouth daily.   . phenazopyridine (PYRIDIUM) 200 MG tablet Take 1 tablet (200 mg total) by mouth 3 (three) times daily as needed for pain.  . Probiotic Product (PROBIOTIC-10) CAPS Take 10 mg by mouth daily.  . vitamin C (ASCORBIC ACID) 500 MG tablet Take 1,000 mg by mouth daily.     Allergies:   Morphine, Tetracycline, and Latex   Social History   Tobacco Use  . Smoking status: Former Smoker    Types: Cigarettes  .  Smokeless tobacco: Former Engineer, water Use Topics  . Alcohol use: Not on file  . Drug use: Not on file     Family Hx: Father had AAA and had graft, has HTN, HLD, and diabetes. Well controlled, now age 55. Mother has glaucoma and has always had low blood pressure. Brother generally healthy. No sudden cardiac death. Had an aunt die of a brain aneursym at age 48 (father's side). Griselda Miner had heart attack and passed away in her 90s. No CVA.  ROS:   Please see the history of present illness.    All other systems reviewed and are  negative.   Prior CV studies:   The following studies were reviewed today: ETT 2018/02/02  Blood pressure demonstrated a normal response to exercise.  There was no ST segment deviation noted during stress.  ETT with fair exercise tolerance (6:34); no chest pain; normal BP response; no ST changes; negative adequate ETT; Duke treadmill score 6.  Care Everywhere  cath report from 01/29/2015: 1. Hemodynamics: Aortic pressure 118/63, LVEDP 16    2. Coronary system:Left Main: Large caliber vessel with no angiographic evidence of stenosis.  LAD system: Large caliber vessel, wraps around the apex. No stenosis  LCX system: Left dominant, large caliber vessel.No stenosis.  RCA system: right none dominant. No stenosis.    Conclusion:  No CAD as mentioned above.  Mildly elevated LVEDP  Preserved LV function with normal wall motion   Echo report from 01/29/2015 Interpretation Summary  A complete portable two-dimensional transthoracic echocardiogram was  performed.  The left ventricle is normal in size, wall thickness and wall motion with  ejection fraction of 55-60%.  The left ventricular diastolic function is normal.  There is mild-moderate (1-2+) mitral regurgitation.  The aortic valve is not well visualized, but is grossly normal.    Left Ventricle  The left ventricle is normal in size, wall thickness and wall motion with  ejection fraction of 55-60%. The left ventricular diastolic function is  normal.      Right Ventricle  The right ventricle is grossly normal in size and function.    Atria  The left and right atria are normal size.    Mitral Valve  The mitral valve is normal in structure and function. There is mild-  moderate (1-2+) mitral regurgitation.      Tricuspid Valve  The tricuspid valve is normal in structure and function. There is no  tricuspid regurgitation.    Aortic Valve  The aortic valve is not well  visualized, but is grossly normal. There is no  aortic regurgitation present.    Pulmonic Valve  The pulmonic valve is normal in structure and function. There is no  pulmonic regurgitation.    Vessels  The aortic root is normal in diameter. The pulmonary artery is not well  visualized.    Pericardium  There is no pericardial effusion.  Labs/Other Tests and Data Reviewed:    EKG:  An ECG dated 12/30/17 was personally reviewed today and demonstrated:  NSR  Recent Labs: No results found for requested labs within last 8760 hours.   Recent Lipid Panel Lab Results  Component Value Date/Time   CHOL 213 (H) 12/30/2017 09:12 AM   TRIG 96 12/30/2017 09:12 AM   HDL 58 12/30/2017 09:12 AM   CHOLHDL 3.7 12/30/2017 09:12 AM   LDLCALC 136 (H) 12/30/2017 09:12 AM    Wt Readings from Last 3 Encounters:  08/31/19 194 lb (88 kg)  04/24/18 188 lb (  85.3 kg)  12/30/17 194 lb 12.8 oz (88.4 kg)     Objective:    Vital Signs:  Ht 5\' 4"  (1.626 m)   Wt 194 lb (88 kg)   BMI 33.30 kg/m    VITAL SIGNS:  reviewed GEN:  no acute distress EYES:  sclerae anicteric, EOMI - Extraocular Movements Intact RESPIRATORY:  normal respiratory effort, symmetric expansion CARDIOVASCULAR:  no visible JVD SKIN:  no rash, lesions or ulcers. MUSCULOSKELETAL:  no obvious deformities. NEURO:  alert and oriented x 3, no obvious focal deficit PSYCH:  normal affect  ASSESSMENT & PLAN:    Chest pain, palpitations: resolved. -ETT negative adequate.  -no further cardiac workup at this time -counseled on diet and exercise, she has made excellent changes, reinforced today -counseled on red flag warning signs that need immediate medical attention  Prevention: -recommend heart healthy/Mediterranean diet, with whole grains, fruits, vegetable, fish, lean meats, nuts, and olive oil. Limit salt. -recommend moderate walking, 3-5 times/week for 30-50 minutes each session. Aim for at least 150 minutes.week.  Goal should be pace of 3 miles/hours, or walking 1.5 miles in 30 minutes -recommend avoidance of tobacco products. Avoid excess alcohol. -Additional risk factor control:             -Diabetes: A1c is improved, see HPI             -Lipids: LDL 136 in 2019. ASCVD risk low, but may rise with age/if HTN or DM develops. Follow.             -Blood pressure control: no vitals today, had elevated BP at prior visit but no history of hypertension             -Weight: BMI 33. Working on lifestyle for weight loss.   COVID-19 Education: The signs and symptoms of COVID-19 were discussed with the patient and how to seek care for testing (follow up with PCP or arrange E-visit). The importance of social distancing was discussed today.  Time:   Today, I have spent 14 minutes with the patient with telehealth technology discussing the above problems.    Patient Instructions  Medication Instructions:  Your Physician recommend you continue on your current medication as directed.    *If you need a refill on your cardiac medications before your next appointment, please call your pharmacy*   Lab Work: None    Testing/Procedures: None   Follow-Up: At [facility], you and your health needs are our priority.  As part of our continuing mission to provide you with exceptional heart care, we have created designated Provider Care Teams.  These Care Teams include your primary Cardiologist (physician) and Advanced Practice Providers (APPs -  Physician Assistants and Nurse Practitioners) who all work together to provide you with the care you need, when you need it.  We recommend signing up for the patient portal called "MyChart".  Sign up information is provided on this After Visit Summary.  MyChart is used to connect with patients for Virtual Visits (Telemedicine).  Patients are able to view lab/test results, encounter notes, upcoming appointments, etc.  Non-urgent messages can be sent to your provider as well.    To learn more about what you can do with MyChart, go to [facility].    Your next appointment:   1 year(s)  The format for your next appointment:   In Person  Provider:   [email or web address], MD       Follow Up:  Annually per patient preference (vs prn)  Signed, Jodelle Red, MD  08/31/2019     [facility]

## 2019-10-19 ENCOUNTER — Encounter: Payer: Self-pay | Admitting: Cardiology

## 2020-09-03 ENCOUNTER — Other Ambulatory Visit: Payer: Self-pay

## 2020-09-03 ENCOUNTER — Encounter: Payer: Self-pay | Admitting: Cardiology

## 2020-09-03 ENCOUNTER — Ambulatory Visit (INDEPENDENT_AMBULATORY_CARE_PROVIDER_SITE_OTHER): Payer: 59 | Admitting: Cardiology

## 2020-09-03 VITALS — BP 120/80 | HR 81 | Ht 64.0 in | Wt 196.0 lb

## 2020-09-03 DIAGNOSIS — R0789 Other chest pain: Secondary | ICD-10-CM

## 2020-09-03 DIAGNOSIS — E6609 Other obesity due to excess calories: Secondary | ICD-10-CM

## 2020-09-03 DIAGNOSIS — Z713 Dietary counseling and surveillance: Secondary | ICD-10-CM | POA: Diagnosis not present

## 2020-09-03 DIAGNOSIS — Z7189 Other specified counseling: Secondary | ICD-10-CM

## 2020-09-03 DIAGNOSIS — Z6833 Body mass index (BMI) 33.0-33.9, adult: Secondary | ICD-10-CM

## 2020-09-03 NOTE — Progress Notes (Signed)
Cardiology Office Note:    Date:  09/03/2020   ID:  Robbin Loughmiller, DOB May 18, 1966, MRN [id number]  PCP:  Loyal Jacobson, MD  Cardiologist:  Jodelle Red, MD  Referring MD: Loyal Jacobson, MD   CC: follow up  History of Present Illness:    Vanessa Landry is a 55 y.o. female with with a hx of interstitial cystitis and anxiety who is seen as a in follow up for evaluation of chest pain. She was seen in the ER on 12/05/17 for these symptoms, and her initial consult was on 12/30/17.   Today: Overall no central chest pain or palpitations. Has occasional left lateral chest wall pain. Struggling with her weight, we discussed at length today. Discussed treatment options for obesity as well as lifestyle recommendations today.  Recently lost her father, had heart failure and cardiac arrest in his 30s.   Denies shortness of breath at rest or with normal exertion. No PND, orthopnea, LE edema or unexpected weight gain. No syncope or palpitations.   Past Medical History:  Diagnosis Date  . Interstitial cystitis    History reviewed. No pertinent surgical history.   Current Meds  Medication Sig  . Beta Glucan POWD by Does not apply route.  . betamethasone dipropionate 0.05 % cream Apply topically 2 (two) times daily as needed.  . Cetirizine HCl 10 MG TBDP   . Cholecalciferol (VITAMIN D3) 5000 units CAPS Take 1 capsule by mouth daily.  . Coenzyme Q10 100 MG TABS Take 100 mg by mouth daily.  Marland Kitchen LYSINE PO Take 1 tablet by mouth daily.  . magnesium gluconate (MAGONATE) 500 MG tablet Take 500 mg by mouth daily.  . Menatetrenone (VITAMIN K2) 100 MCG TABS   . Omega-3 Fatty Acids (OMEGA-3 CF PO) Take 4,300 Units by mouth daily.   . Probiotic Product (PROBIOTIC-10) CAPS Take 10 mg by mouth daily.  . vitamin C (ASCORBIC ACID) 500 MG tablet Take 1,000 mg by mouth daily.  . Vitamin E 450 MG (1000 UT) CAPS   . Zinc 50 MG TABS      Allergies:   Morphine, Tetracycline, and Latex   Social  History   Tobacco Use  . Smoking status: Former Smoker    Types: Cigarettes  . Smokeless tobacco: Former Clinical biochemist  . Vaping Use: Every day     Family Hx: Father had AAA and had graft, has HTN, HLD, and diabetes. Well controlled, now age 1. Mother has glaucoma and has always had low blood pressure. Brother generally healthy. No sudden cardiac death. Had an aunt die of a brain aneursym at age 53 (father's side). Griselda Miner had heart attack and passed away in her 32s. No CVA.  ROS:   Please see the history of present illness.    All other systems reviewed and are negative.   Prior CV studies:   The following studies were reviewed today: ETT 02/02/18  Blood pressure demonstrated a normal response to exercise.  There was no ST segment deviation noted during stress.  ETT with fair exercise tolerance (6:34); no chest pain; normal BP response; no ST changes; negative adequate ETT; Duke treadmill score 6.  Care Everywhere  cath report from 01/29/2015: 1. Hemodynamics: Aortic pressure 118/63, LVEDP 16    2. Coronary system:Left Main: Large caliber vessel with no angiographic evidence of stenosis.  LAD system: Large caliber vessel, wraps around the apex. No stenosis  LCX system: Left dominant, large caliber vessel.No stenosis.  RCA system: right none dominant.  No stenosis.    Conclusion:  No CAD as mentioned above.  Mildly elevated LVEDP  Preserved LV function with normal wall motion   Echo report from 01/29/2015 Interpretation Summary  A complete portable two-dimensional transthoracic echocardiogram was  performed.  The left ventricle is normal in size, wall thickness and wall motion with  ejection fraction of 55-60%.  The left ventricular diastolic function is normal.  There is mild-moderate (1-2+) mitral regurgitation.  The aortic valve is not well visualized, but is grossly normal.    Left Ventricle  The left ventricle is normal in  size, wall thickness and wall motion with  ejection fraction of 55-60%. The left ventricular diastolic function is  normal.      Right Ventricle  The right ventricle is grossly normal in size and function.    Atria  The left and right atria are normal size.    Mitral Valve  The mitral valve is normal in structure and function. There is mild-  moderate (1-2+) mitral regurgitation.      Tricuspid Valve  The tricuspid valve is normal in structure and function. There is no  tricuspid regurgitation.    Aortic Valve  The aortic valve is not well visualized, but is grossly normal. There is no  aortic regurgitation present.    Pulmonic Valve  The pulmonic valve is normal in structure and function. There is no  pulmonic regurgitation.    Vessels  The aortic root is normal in diameter. The pulmonary artery is not well  visualized.    Pericardium  There is no pericardial effusion.  Labs/Other Tests and Data Reviewed:    EKG:  An ECG dated 09/03/20 was personally reviewed today and demonstrated:  NSR   Recent Labs: No results found for requested labs within last 8760 hours.   Recent Lipid Panel Lab Results  Component Value Date/Time   CHOL 213 (H) 12/30/2017 09:12 AM   TRIG 96 12/30/2017 09:12 AM   HDL 58 12/30/2017 09:12 AM   CHOLHDL 3.7 12/30/2017 09:12 AM   LDLCALC 136 (H) 12/30/2017 09:12 AM    Wt Readings from Last 3 Encounters:  09/03/20 196 lb (88.9 kg)  08/31/19 194 lb (88 kg)  04/24/18 188 lb (85.3 kg)     Objective:    Vital Signs:  BP 120/80   Pulse 81   Ht 5\' 4"  (1.626 m)   Wt 196 lb (88.9 kg)   SpO2 98%   BMI 33.64 kg/m    GEN: Well nourished, well developed in no acute distress HEENT: Normal, moist mucous membranes NECK: No JVD CARDIAC: regular rhythm, normal S1 and S2, no rubs or gallops. No murmur. VASCULAR: Radial and DP pulses 2+ bilaterally. No carotid bruits RESPIRATORY:  Clear to auscultation without  rales, wheezing or rhonchi  ABDOMEN: Soft, non-tender, non-distended MUSCULOSKELETAL:  Ambulates independently SKIN: Warm and dry, no edema NEUROLOGIC:  Alert and oriented x 3. No focal neuro deficits noted. PSYCHIATRIC:  Normal affect   ASSESSMENT & PLAN:    1. Atypical chest pain   2. Counseling on health promotion and disease prevention   3. Cardiac risk counseling   4. Class 1 obesity due to excess calories without serious comorbidity with body mass index (BMI) of 33.0 to 33.9 in adult   5. Weight loss counseling, encounter for     Chest pain, atypical: -ETT negative adequate.  -no further cardiac workup at this time -counseled on red flag warning signs that need immediate medical attention  Prevention, CV risk counseling, obesity counseling: -BMI 33 -we discussed some of the newer therapies being investigated for weight loss, including high dose semaglutide (wegovy) -recommend heart healthy/Mediterranean diet, with whole grains, fruits, vegetable, fish, lean meats, nuts, and olive oil. Limit salt. -recommend moderate walking, 3-5 times/week for 30-50 minutes each session. Aim for at least 150 minutes.week. Goal should be pace of 3 miles/hours, or walking 1.5 miles in 30 minutes -recommend avoidance of tobacco products. Avoid excess alcohol.  No orders of the defined types were placed in this encounter.  Orders Placed This Encounter  Procedures  . EKG 12-Lead    Patient Instructions  Medication Instructions:  Your Physician recommend you continue on your current medication as directed.    *If you need a refill on your cardiac medications before your next appointment, please call your pharmacy*   Lab Work: None   Testing/Procedures: None   Follow-Up: At [facility], you and your health needs are our priority.  As part of our continuing mission to provide you with exceptional heart care, we have created designated Provider Care Teams.  These Care Teams include  your primary Cardiologist (physician) and Advanced Practice Providers (APPs -  Physician Assistants and Nurse Practitioners) who all work together to provide you with the care you need, when you need it.  We recommend signing up for the patient portal called "MyChart".  Sign up information is provided on this After Visit Summary.  MyChart is used to connect with patients for Virtual Visits (Telemedicine).  Patients are able to view lab/test results, encounter notes, upcoming appointments, etc.  Non-urgent messages can be sent to your provider as well.   To learn more about what you can do with MyChart, go to [email or web address].    Your next appointment:   1 year(s) at [facility] ([id number])  The format for your next appointment:   In Person  Provider:   Jodelle Red, MD      Signed, Jodelle Red, MD  09/03/2020     [facility]

## 2020-09-03 NOTE — Patient Instructions (Signed)
Medication Instructions:  Your Physician recommend you continue on your current medication as directed.    *If you need a refill on your cardiac medications before your next appointment, please call your pharmacy*   Lab Work: None   Testing/Procedures: None   Follow-Up: At [facility], you and your health needs are our priority.  As part of our continuing mission to provide you with exceptional heart care, we have created designated Provider Care Teams.  These Care Teams include your primary Cardiologist (physician) and Advanced Practice Providers (APPs -  Physician Assistants and Nurse Practitioners) who all work together to provide you with the care you need, when you need it.  We recommend signing up for the patient portal called "MyChart".  Sign up information is provided on this After Visit Summary.  MyChart is used to connect with patients for Virtual Visits (Telemedicine).  Patients are able to view lab/test results, encounter notes, upcoming appointments, etc.  Non-urgent messages can be sent to your provider as well.   To learn more about what you can do with MyChart, go to [email or web address].    Your next appointment:   1 year(s) at [facility] ([id number])  The format for your next appointment:   In Person  Provider:   Jodelle Red, MD

## 2021-07-11 DIAGNOSIS — Z91040 Latex allergy status: Secondary | ICD-10-CM | POA: Diagnosis not present

## 2021-07-11 DIAGNOSIS — R7989 Other specified abnormal findings of blood chemistry: Secondary | ICD-10-CM | POA: Diagnosis not present

## 2021-07-11 DIAGNOSIS — Z885 Allergy status to narcotic agent status: Secondary | ICD-10-CM | POA: Diagnosis not present

## 2021-07-11 DIAGNOSIS — Z9889 Other specified postprocedural states: Secondary | ICD-10-CM | POA: Diagnosis not present

## 2021-07-11 DIAGNOSIS — Z87891 Personal history of nicotine dependence: Secondary | ICD-10-CM | POA: Diagnosis not present

## 2021-07-11 DIAGNOSIS — F32A Depression, unspecified: Secondary | ICD-10-CM | POA: Diagnosis not present

## 2021-07-11 DIAGNOSIS — Z79899 Other long term (current) drug therapy: Secondary | ICD-10-CM | POA: Diagnosis not present

## 2021-07-11 DIAGNOSIS — R109 Unspecified abdominal pain: Secondary | ICD-10-CM | POA: Diagnosis not present

## 2021-07-11 DIAGNOSIS — R079 Chest pain, unspecified: Secondary | ICD-10-CM | POA: Diagnosis not present

## 2021-07-11 DIAGNOSIS — Z881 Allergy status to other antibiotic agents status: Secondary | ICD-10-CM | POA: Diagnosis not present

## 2021-07-11 DIAGNOSIS — Z90710 Acquired absence of both cervix and uterus: Secondary | ICD-10-CM | POA: Diagnosis not present

## 2021-07-11 DIAGNOSIS — I252 Old myocardial infarction: Secondary | ICD-10-CM | POA: Diagnosis not present

## 2021-07-11 DIAGNOSIS — R0781 Pleurodynia: Secondary | ICD-10-CM | POA: Diagnosis not present

## 2021-07-11 DIAGNOSIS — R0789 Other chest pain: Secondary | ICD-10-CM | POA: Diagnosis not present

## 2021-07-12 DIAGNOSIS — R0789 Other chest pain: Secondary | ICD-10-CM | POA: Diagnosis not present

## 2021-07-16 ENCOUNTER — Encounter (HOSPITAL_BASED_OUTPATIENT_CLINIC_OR_DEPARTMENT_OTHER): Payer: Self-pay

## 2021-07-17 NOTE — Telephone Encounter (Signed)
Hey there, are you okay with this patient starting Wegovy, if so I can place the order for you!

## 2021-07-22 ENCOUNTER — Telehealth: Payer: Self-pay | Admitting: Cardiology

## 2021-07-22 NOTE — Telephone Encounter (Signed)
I am not sure why this was sent to me. What type of injections?

## 2021-07-22 NOTE — Telephone Encounter (Signed)
Patient calling back to follow up on her mychart message sent last week.

## 2021-07-22 NOTE — Telephone Encounter (Signed)
Dr Harrell Gave you mentioned a weigh loss option when I was in for my last appointment. I believe it was called Wegovy? I am interested in finding out how to start this. Ive gained about 20 pounds since our last visit. This past weekend I ended up in the ER due to severe left abdominal pain. Looking at my lab results it kinda looks like my glucose level was pretty high. Because of my past heart issues they went straight to that. I told them it was too low to be my heart but they wouldnt listen. So there is a new ekg and CT scan for you to look at in my chart lol. They thought it was most likely the muscles in between my ribs. But what was concerning to me was the weight gain and the lab work. And the severe fatty liver. Ive got to find a way to get this weight off or it IS going to start to effect my heart. Can you please tell me where this clinic is that has this shot? Thank you in advance for your help.   Above from [place] will forward message to Dr Harrell Gave for review

## 2021-07-23 ENCOUNTER — Encounter (HOSPITAL_BASED_OUTPATIENT_CLINIC_OR_DEPARTMENT_OTHER): Payer: Self-pay

## 2021-07-23 MED ORDER — SEMAGLUTIDE-WEIGHT MANAGEMENT 0.25 MG/0.5ML ~~LOC~~ SOAJ: 0.2500 mg | SUBCUTANEOUS | 0 refills | Status: DC

## 2021-07-23 NOTE — Telephone Encounter (Addendum)
Triage does not need to address I am already following up with this, see below documentation.

## 2021-07-23 NOTE — Telephone Encounter (Signed)
Routing to Triage pool. This was somehow sent to refills.

## 2021-07-23 NOTE — Telephone Encounter (Addendum)
GLP1RA coverage needs to be determined to see if any meds are actually covered on her plan for weight loss before she is scheduled for a visit to discuss.  Prior authorization submitted for Wegovy which was approved through 02/20/22.   Left message for pt to discuss.

## 2021-07-23 NOTE — Telephone Encounter (Signed)
Pt returned call to clinic and is very interested in starting [facility]. No personal/family hx of thyroid cancer. Rx sent to pharmacy, she will call back with any copay issues. Otherwise, scheduled appt 2/6 with PharmD for first [facility] injection training/education.

## 2021-07-23 NOTE — Telephone Encounter (Signed)
I don't think this involves me.

## 2021-07-27 ENCOUNTER — Other Ambulatory Visit: Payer: Self-pay

## 2021-07-27 ENCOUNTER — Ambulatory Visit (INDEPENDENT_AMBULATORY_CARE_PROVIDER_SITE_OTHER): Payer: Self-pay | Admitting: Pharmacist

## 2021-07-27 VITALS — Wt 207.2 lb

## 2021-07-27 DIAGNOSIS — E6609 Other obesity due to excess calories: Secondary | ICD-10-CM

## 2021-07-27 DIAGNOSIS — Z6833 Body mass index (BMI) 33.0-33.9, adult: Secondary | ICD-10-CM

## 2021-07-27 DIAGNOSIS — E669 Obesity, unspecified: Secondary | ICD-10-CM

## 2021-07-27 NOTE — Progress Notes (Addendum)
Patient ID: Takeisha Cianci                 DOB: [birth year]                    MRN: [id number]     HPI: Meagon Duskin is a 56 y.o. female patient referred to pharmacy clinic by Dr. Harrell Gave to initiate weight loss therapy with GLP1-RA. PMH is significant for obesity complicated by chronic medical conditions including interstitial cystitis, anxiety, atypical chest pain and fatty liver . Most recent BMI 35.57.  Patient presents to PharmD clinic today. She is very motivated to loose weight. She has tried low carb, intermittent fasting and they do not work for her. The more she tells herself she cant have bread, the more she wants it. Worked with weight loss clinic and nutritionist in the past. Worked with Lyn Henri MD and had testosterone pellets inserted. This plus 6 small meals a day worked for her. She tries to get in 10,000 steps a day. Thinking about taking a yoga class. Does not drink soda. Occasionally gets so hungry she over eats. Very concerned about her blood sugar and fatty liver. Knows she isn't eating enough vegetables. Admits she needs to do a better job at meal planning.  Current weight management medications: none  Previously tried meds: pheniramine, [place] MD- testosterone pellets  Current meds that may affect weight: none  Baseline weight/BMI: 207lb/35.57  Insurance payor: [employer]  Diet:  -Breakfast: black coffee -Lunch: nothing usually -Dinner: protein and vegetable or salad, bread -Snacks: -Drinks: coffee and water  Exercise: tries to walk on treadmill about 1,000 steps in the AM, will sometime stop when she is on the road at a church parking lot and walk the parking lot  Family History: Father had AAA and had graft, has HTN, HLD, and diabetes. Well controlled, now age 3. Mother has glaucoma and has always had low blood pressure. Brother generally healthy. No sudden cardiac death. Had an aunt die of a brain aneursym at age 47 (father's side). Teena Irani had heart attack and  passed away in her 69s. No CVA.  Social History: former smoker, vaping, doesn't drink  Labs: No results found for: HGBA1C  Wt Readings from Last 1 Encounters:  09/03/20 196 lb (88.9 kg)    BP Readings from Last 1 Encounters:  09/03/20 120/80   Pulse Readings from Last 1 Encounters:  09/03/20 81       Component Value Date/Time   CHOL 213 (H) 12/30/2017 0912   TRIG 96 12/30/2017 0912   HDL 58 12/30/2017 0912   CHOLHDL 3.7 12/30/2017 0912   LDLCALC 136 (H) 12/30/2017 0912    Past Medical History:  Diagnosis Date   Interstitial cystitis     Current Outpatient Medications on File Prior to Visit  Medication Sig Dispense Refill   Beta Glucan POWD by Does not apply route.     betamethasone dipropionate 0.05 % cream Apply topically 2 (two) times daily as needed.     Cetirizine HCl 10 MG TBDP      Cholecalciferol (VITAMIN D3) 5000 units CAPS Take 1 capsule by mouth daily.     Coenzyme Q10 100 MG TABS Take 100 mg by mouth daily.     LYSINE PO Take 1 tablet by mouth daily.     magnesium gluconate (MAGONATE) 500 MG tablet Take 500 mg by mouth daily.     Menatetrenone (VITAMIN K2) 100 MCG TABS  Omega-3 Fatty Acids (OMEGA-3 CF PO) Take 4,300 Units by mouth daily.      Probiotic Product (PROBIOTIC-10) CAPS Take 10 mg by mouth daily.     Semaglutide-Weight Management 0.25 MG/0.5ML SOAJ Inject 0.25 mg into the skin once a week for 28 days. 2 mL 0   vitamin C (ASCORBIC ACID) 500 MG tablet Take 1,000 mg by mouth daily.     Vitamin E 450 MG (1000 UT) CAPS      Zinc 50 MG TABS      No current facility-administered medications on file prior to visit.    Allergies  Allergen Reactions   Morphine Nausea And Vomiting   Tetracycline Nausea And Vomiting   Latex Hives and Rash     Assessment/Plan:  1. Weight loss - Patient has not met goal of at least 5% of body weight loss with comprehensive lifestyle modifications alone in the past 3-6 months. Pharmacotherapy is appropriate to  pursue as augmentation. Will start Reagen.Frieze (patient pharmacy filled Ozmepic). Will use Ozempic and then switch to [facility] once first pen is used. Confirmed patient not pregnant and no personal or family history of medullary thyroid carcinoma (MTC) or Multiple Endocrine Neoplasia syndrome type 2 (MEN 2). No hx of gallstones or pancreatitis.  Advised patient on common side effects including nausea, diarrhea, dyspepsia, decreased appetite, and fatigue. Counseled patient on reducing meal size and how to titrate medication to minimize side effects. Counseled patient to call if intolerable side effects or if experiencing dehydration, abdominal pain, or dizziness. Patient will adhere to dietary modifications and will target at least 150 minutes of moderate intensity exercise weekly.   We discussed meal planning, eating small meals, increasing exercise and adding strength training. She will get labs at [facility] on Wed. Order slip given.  Patient successfully injected ozempic 0.20m into her right abdomen. Patient taught how to prime the pen and how to inject prior.  Follow up in 3 weeks via telephone, 3 months in person.

## 2021-07-27 NOTE — Patient Instructions (Signed)
GLP-1 Receptor Agonist Counseling Points This medication reduces your appetite and may make you feel fuller longer.  Stop eating when your body tells you that you are full. This will likely happen sooner than you are used to. Store your medication in the fridge until you are ready to use it. Inject your medication in the fatty tissue of your lower abdominal area (2 inches away from belly button) or upper outer thigh. Rotate injection sites. Each pen will last you about 1 month (the first month it will last a few weeks longer). Use a different needle with each weekly injection. Common side effects include: nausea, diarrhea/constipation, and heartburn, and are more likely to occur if you overeat.  Dosing schedule: - Month 1: Inject 0.25mg  once weekly - Month 2: Inject 0.5mg   once weekly - Month 3: Inject 1mg   once weekly - Month 4: Inject 1.7mg  once weekly  Tips for living a healthier life     Building a Healthy and Balanced Diet Make most of your meal vegetables and fruits -  of your plate. Aim for color and variety, and remember that potatoes dont count as vegetables on the Healthy Eating Plate because of their negative impact on blood sugar.  Go for whole grains -  of your plate. Whole and intact grains--whole wheat, barley, wheat berries, quinoa, oats, brown rice, and foods made with them, such as whole wheat pasta--have a milder effect on blood sugar and insulin than white bread, white rice, and other refined grains.  Protein power -  of your plate. Fish, poultry, beans, and nuts are all healthy, versatile protein sources--they can be mixed into salads, and pair well with vegetables on a plate. Limit red meat, and avoid processed meats such as bacon and sausage.  Healthy plant oils - in moderation. Choose healthy vegetable oils like olive, canola, soy, corn, sunflower, peanut, and others, and avoid partially hydrogenated oils, which contain unhealthy trans fats. Remember that  low-fat does not mean healthy.  Drink water, coffee, or tea. Skip sugary drinks, limit milk and dairy products to one to two servings per day, and limit juice to a small glass per day.  Stay active. The red figure running across the Healthy Eating Plates placemat is a reminder that staying active is also important in weight control.  The main message of the Healthy Eating Plate is to focus on diet quality:  The type of carbohydrate in the diet is more important than the amount of carbohydrate in the diet, because some sources of carbohydrate--like vegetables (other than potatoes), fruits, whole grains, and beans--are healthier than others. The Healthy Eating Plate also advises consumers to avoid sugary beverages, a major source of calories--usually with little nutritional value--in the American diet. The Healthy Eating Plate encourages consumers to use healthy oils, and it does not set a maximum on the percentage of calories people should get each day from healthy sources of fat. In this way, the Healthy Eating Plate recommends the opposite of the low-fat message promoted for decades by the USDA.  [employer]  SUGAR  Sugar is a huge problem in the modern day diet. Sugar is a big contributor to heart disease, diabetes, high triglyceride levels, fatty liver disease and obesity. Sugar is hidden in almost all packaged foods/beverages. Added sugar is extra sugar that is added beyond what is naturally found and has no nutritional benefit for your body. The American Heart Association recommends limiting added sugars to no more than 25g for women and 36 grams  for men per day. There are many names for sugar including maltose, sucrose (names ending in "ose"), high fructose corn syrup, molasses, cane sugar, corn sweetener, raw sugar, syrup, honey or fruit juice concentrate.   One of the best ways to limit your added sugars is to stop drinking  sweetened beverages such as soda, sweet tea, and fruit juice.  There is 65g of added sugars in one 20oz bottle of Coke! That is equal to 7.5 donuts.   Pay attention and read all nutrition facts labels. Below is an examples of a nutrition facts label. The #1 is showing you the total sugars where the # 2 is showing you the added sugars. This one serving has almost the max amount of added sugars per day!     20 oz Soda 65g Sugar = 7.5 Glazed Donuts  16oz Energy  Drink 54g Sugar = 6.5 Glazed Donuts  Large Sweet  Tea 38g Sugar = 4 Glazed Donuts  20oz Sports  Drink 34g Sugar = 3.5 Glazed Donuts  8oz Chocolate Milk 24g Sugar =2.5 Glazed Donuts  8oz Orange  Juice 21g Sugar = 2 Glazed Donuts  1 Juice Box 14g Sugar = 1.5 Glazed Donuts  16oz Water= NO SUGAR!!  EXERCISE  Exercise is good. Weve all heard that. In an ideal world, we would all have time and resources to get plenty of it. When you are active, your heart pumps more efficiently and you will feel better.  Multiple studies show that even walking regularly has benefits that include living a longer life. The American Heart Association recommends 150 minutes per week of exercise (30 minutes per day most days of the week). You can do this in any increment you wish. Nine or more 10-minute walks count. So does an hour-long exercise class. Break the time apart into what will work in your life. Some of the best things you can do include walking briskly, jogging, cycling or swimming laps. Not everyone is ready to exercise. Sometimes we need to start with just getting active. Here are some easy ways to be more active throughout the day:  Take the stairs instead of the elevator  Go for a 10-15 minute walk during your lunch break (find a friend to make it more enjoyable)  When shopping, park at the back of the parking lot  If you take public transportation, get off one stop early and walk the extra distance  Pace around while making  phone calls  Check with your doctor if you arent sure what your limitations may be. Always remember to drink plenty of water when doing any type of exercise. Dont feel like a failure if youre not getting the 90-150 minutes per week. If you started by being a couch potato, then just a 10-minute walk each day is a huge improvement. Start with little victories and work your way up.   HEALTHY EATING TIPS  When looking to improve your eating habits, whether to lose weight, lower blood pressure or just be healthier, it helps to know what a serving size is.   Grains 1 slice of bread,  bagel,  cup pasta or rice  Vegetables 1 cup fresh or raw vegetables,  cup cooked or canned Fruits 1 piece of medium sized fruit,  cup canned,   Meats/Proteins  cup dried       1 oz meat, 1 egg,  cup cooked beans, nuts or seeds  Dairy        Fats Individual yogurt container,  1 cup (8oz)    1 teaspoon margarine/butter or vegetable  milk or milk alternative, 1 slice of cheese          oil; 1 tablespoon mayonnaise or salad dressing                  Plan ahead: make a menu of the meals for a week then create a grocery list to go with that menu. Consider meals that easily stretch into a night of leftovers, such as stews or casseroles. Or consider making two of your favorite meal and put one in the freezer for another night. Try a night or two each week that is meatless or no cook such as salads. When you get home from the grocery store wash and prepare your vegetables and fruits. Then when you need them they are ready to go.   Tips for going to the grocery store:  Buy store or generic brands  Check the weekly ad from your store on-line or in their in-store flyer  Look at the unit price on the shelf tag to compare/contrast the costs of different items  Buy fruits/vegetables in season  Carrots, bananas and apples are low-cost, naturally healthy items  If meats or frozen vegetables are on sale, buy some extras  and put in your freezer  Limit buying prepared or ready to eat items, even if they are pre-made salads or fruit snacks  Do not shop when youre hungry  Foods at eye level tend to be more expensive. Look on the high and low shelves for deals.  Consider shopping at the farmers market for fresh foods in season.  Avoid the cookie and chip aisles (these are expensive, high in calories and low in nutritional value). Shop on the outside of the grocery store.  Healthy food preparations:  If you cant get lean hamburger, be sure to drain the fat when cooking  Steam, saut (in olive oil), grill or bake foods  Experiment with different seasonings to avoid adding salt to your foods. Kosher salt, sea salt and Himalayan salt are all still salt and should be avoided. Try seasoning food with onion, garlic, thyme, rosemary, basil ect. Onion powder or garlic powder is ok. Avoid if it says salt (ie garlic salt).

## 2021-07-27 NOTE — Telephone Encounter (Signed)
Patient being seen today 2/6 with Pharmacy to work on [employer] injections!

## 2021-07-29 DIAGNOSIS — Z6833 Body mass index (BMI) 33.0-33.9, adult: Secondary | ICD-10-CM | POA: Diagnosis not present

## 2021-07-29 DIAGNOSIS — E6609 Other obesity due to excess calories: Secondary | ICD-10-CM | POA: Diagnosis not present

## 2021-07-29 LAB — HEMOGLOBIN A1C
Est. average glucose Bld gHb Est-mCnc: 131 mg/dL
Hgb A1c MFr Bld: 6.2 % — ABNORMAL HIGH (ref 4.8–5.6)

## 2021-07-30 LAB — LIPID PANEL
Chol/HDL Ratio: 4.2 ratio (ref 0.0–4.4)
Cholesterol, Total: 250 mg/dL — ABNORMAL HIGH (ref 100–199)
HDL: 59 mg/dL (ref >39)
LDL Chol Calc (NIH): 166 mg/dL — ABNORMAL HIGH (ref 0–99)
Triglycerides: 138 mg/dL (ref 0–149)
VLDL Cholesterol Cal: 25 mg/dL (ref 5–40)

## 2021-08-17 ENCOUNTER — Telehealth: Payer: Self-pay | Admitting: Pharmacist

## 2021-08-17 MED ORDER — WEGOVY 2.4 MG/0.75ML ~~LOC~~ SOAJ: 2.4000 mg | SUBCUTANEOUS | 11 refills | Status: DC

## 2021-08-17 MED ORDER — WEGOVY 1.7 MG/0.75ML ~~LOC~~ SOAJ: 1.7000 mg | SUBCUTANEOUS | 0 refills | Status: DC

## 2021-08-17 MED ORDER — WEGOVY 1 MG/0.5ML ~~LOC~~ SOAJ: 1.0000 mg | SUBCUTANEOUS | 0 refills | Status: DC

## 2021-08-17 MED ORDER — WEGOVY 0.5 MG/0.5ML ~~LOC~~ SOAJ: 0.5000 mg | SUBCUTANEOUS | 0 refills | Status: DC

## 2021-08-17 NOTE — Telephone Encounter (Signed)
Called pt to see how she was going on Ozempic 0.25mg  weekly. She states she is really hungry for the first 2 days then she is good. Took 4th dose of 0.25mg . Using [employer] app- focusing on macros 81g protein  201 carbs 54 g fat 60g sugar Ok with protein goal - make sure sugars are from fruit and not much from added sugars. Staying away from refined sugars Toni Arthurs faster, trying to eat slower 1 slip up where she ate a bite of 7 layer bar and then she craved sugar for the next 3 days. Rx for Wegovy 0.5 mg and the subsequent doses were sent in.

## 2021-09-03 NOTE — Progress Notes (Signed)
?Cardiology Office Note:   ? ?Date:  09/03/2021  ? ?ID:  Vanessa Landry, DOB 06-11-1966, MRN [id number] ? ?PCP:  Pcp, No  ?Cardiologist:  Buford Dresser, MD ? ?Referring MD: Jefm Petty, MD  ? ?CC: follow up ? ?History of Present Illness:   ? ?Vanessa Landry is a 56 y.o. female with with a hx of interstitial cystitis and anxiety who is seen in follow up for evaluation of chest pain. She was seen in the ER on 12/05/17 for these symptoms, and her initial consult was on 12/30/17.  ? ?Today: ?Since her last appointment, she was seen in the ED at [facility] 07/11/2021 for sudden onset of left sided abdominal/rib cage pain after waking up in the morning. She was feeling better on Toradol, and she was discharged with Robaxin and a limited number of Norco for likely musculoskeletal pain. ? ?Overall, she appears well. Lately she has developed some acid reflux after starting Wegovy. This is worse with citrus foods such as oranges. She denies other side effects such as nausea. ? ?Every now and then she continues to notice the same "twinge" in the same location of her chest as noted from previous visits. ? ?She was also suffering from significant fatigue. Last week she started taking a plant based iodine supplement. She has been feeling much better and back to baseline. She has been sleeping "like a rock". However, she states that every morning she notices mild myalgias or bruising in different locations. ? ?Since February she has successfully lost some weight. She is exercising routinely. In her diet her main concern is not getting enough fiber. She plans to start a fiber supplement to help with this.  ?  ?She denies any palpitations, shortness of breath, or peripheral edema. No lightheadedness, headaches, syncope, orthopnea, or PND. ? ? ?Past Medical History:  ?Diagnosis Date  ? Interstitial cystitis   ? ?No past surgical history on file.  ? ?No outpatient medications have been marked as taking for the 09/04/21 encounter  (Appointment) with Buford Dresser, MD.  ?  ? ?Allergies:   Morphine, Tetracycline, and Latex  ? ?Social History  ? ?Tobacco Use  ? Smoking status: Former  ?  Types: Cigarettes  ? Smokeless tobacco: Former  ?Vaping Use  ? Vaping Use: Every day  ?  ? ?Family Hx: ?Father had AAA and had graft, has HTN, HLD, and diabetes. Well controlled, now age 33. Mother has glaucoma and has always had low blood pressure. Brother generally healthy. No sudden cardiac death. Had an aunt die of a brain aneursym at age 76 (father's side). Teena Irani had heart attack and passed away in her 72s. No CVA. ? ?ROS:   ?Please see the history of present illness.    ?(+) Acid reflux ?All other systems reviewed and are negative. ? ? ?Prior CV studies:   ?The following studies were reviewed today: ? ?CTA Pulmonary 07/11/2021 ([facility]): ?FINDINGS: No pulmonary embolus or aortic dissection. Severe fatty infiltration of the liver. No adenopathy. No pleural or pericardial effusion. Lungs are clear. ? ?ETT 01/03/18 ?Blood pressure demonstrated a normal response to exercise. ?There was no ST segment deviation noted during stress. ?  ?ETT with fair exercise tolerance (6:34); no chest pain; normal BP response; no ST changes; negative adequate ETT; Duke treadmill score 6. ?  ?Care Everywhere  ?cath report from 01/29/2015: ?1. Hemodynamics: Aortic pressure 118/63, LVEDP 16   ?   ?2. Coronary system:  Left Main: Large caliber vessel with no angiographic evidence  of stenosis.   ?  LAD system: Large caliber vessel, wraps around the apex. No stenosis   ?  LCX system: Left dominant, large caliber vessel.  No stenosis.   ?  RCA system: right none dominant. No stenosis.   ?   ?Conclusion:   ?No CAD as mentioned above.   ?Mildly elevated LVEDP   ?Preserved LV function with normal wall motion   ?  ?Echo report from 01/29/2015 ?Interpretation Summary   ?A complete portable two-dimensional transthoracic echocardiogram was   ?performed.   ?The left ventricle is normal  in size, wall thickness and wall motion with   ?ejection fraction of 55-60%.   ?The left ventricular diastolic function is normal.   ?There is mild-moderate (1-2+) mitral regurgitation.   ?The aortic valve is not well visualized, but is grossly normal.   ?   ?Left Ventricle   ?The left ventricle is normal in size, wall thickness and wall motion with   ?ejection fraction of 55-60%. The left ventricular diastolic function is   ?normal.   ?   ?   ?Right Ventricle   ?The right ventricle is grossly normal in size and function.   ?   ?Atria   ?The left and right atria are normal size.   ?   ?Mitral Valve   ?The mitral valve is normal in structure and function. There is mild-   ?moderate (1-2+) mitral regurgitation.   ?   ?   ?Tricuspid Valve   ?The tricuspid valve is normal in structure and function. There is no   ?tricuspid regurgitation.   ?   ?Aortic Valve   ?The aortic valve is not well visualized, but is grossly normal. There is no   ?aortic regurgitation present.   ?   ?Pulmonic Valve   ?The pulmonic valve is normal in structure and function. There is no   ?pulmonic regurgitation.   ?   ?Vessels   ?The aortic root is normal in diameter. The pulmonary artery is not well   ?visualized.   ?   ?Pericardium   ?There is no pericardial effusion. ? ?Labs/Other Tests and Data Reviewed:   ? ?EKG:  EKG is personally reviewed. ?09/04/2021: SR with sinus arrhythmia at 86 bpm ?09/03/2020: NSR ? ?Recent Labs: ?No results found for requested labs within last 8760 hours.  ? ?Recent Lipid Panel ?Lab Results  ?Component Value Date/Time  ? CHOL 250 (H) 07/29/2021 10:26 AM  ? TRIG 138 07/29/2021 10:26 AM  ? HDL 59 07/29/2021 10:26 AM  ? CHOLHDL 4.2 07/29/2021 10:26 AM  ? LDLCALC 166 (H) 07/29/2021 10:26 AM  ? ? ?Wt Readings from Last 3 Encounters:  ?07/27/21 207 lb 3.2 oz (94 kg)  ?09/03/20 196 lb (88.9 kg)  ?08/31/19 194 lb (88 kg)  ?  ? ?Objective:   ? ?Vital Signs:  BP 117/70   Pulse 86   Ht 5\' 4"  (1.626 m)   Wt 200 lb 9.6 oz (91  kg)   SpO2 98%   BMI 34.43 kg/m?   ? ?GEN: Well nourished, well developed in no acute distress ?HEENT: Normal, moist mucous membranes ?NECK: No JVD ?CARDIAC: regular rhythm, normal S1 and S2, no rubs or gallops. No murmur. ?VASCULAR: Radial and DP pulses 2+ bilaterally. No carotid bruits ?RESPIRATORY:  Clear to auscultation without rales, wheezing or rhonchi  ?ABDOMEN: Soft, non-tender, non-distended ?MUSCULOSKELETAL:  Ambulates independently ?SKIN: Warm and dry, no edema ?NEUROLOGIC:  Alert and oriented x 3. No focal neuro deficits  noted. ?PSYCHIATRIC:  Normal affect  ? ?ASSESSMENT & PLAN:   ? ?1. Obesity (BMI 30-39.9)   ?2. Counseling on health promotion and disease prevention   ?3. Cardiac risk counseling   ?4. Weight loss counseling, encounter for   ?5. Nutritional counseling   ?6. Exercise counseling   ?7. Atypical chest pain   ?8. NAFLD (nonalcoholic fatty liver disease)   ? ?Chest pain, atypical: ?-ETT negative adequate.  ?-no further cardiac workup at this time ?-ECG unremarkable ?-counseled on red flag warning signs that need immediate medical attention ?  ?Prevention, CV risk counseling ?Obesity, BMI 34 ?Fatty liver ?-Tolerating wegovy except for acid reflux, working on weight loss ?-recommend heart healthy/Mediterranean diet, with whole grains, fruits, vegetable, fish, lean meats, nuts, and olive oil. Limit salt. ?-recommend moderate walking, 3-5 times/week for 30-50 minutes each session. Aim for at least 150 minutes.week. Goal should be pace of 3 miles/hours, or walking 1.5 miles in 30 minutes ?-recommend avoidance of tobacco products. Avoid excess alcohol. ? ?Plan for follow-up:   1 year or sooner as needed. ? ?No orders of the defined types were placed in this encounter. ? ?Orders Placed This Encounter  ?Procedures  ? EKG 12-Lead  ? ?Patient Instructions  ?Medication Instructions:  ?Your physician recommends that you continue on your current medications as directed. Please refer to the Current  Medication list given to you today.  ?*If you need a refill on your cardiac medications before your next appointment, please call your pharmacy* ? ?Lab Work: ?NONE ? ?Testing/Procedures: ?NONE ? ?Follow-Up: ?At [facility]

## 2021-09-04 ENCOUNTER — Other Ambulatory Visit: Payer: Self-pay

## 2021-09-04 ENCOUNTER — Ambulatory Visit (INDEPENDENT_AMBULATORY_CARE_PROVIDER_SITE_OTHER): Payer: BC Managed Care – PPO | Admitting: Cardiology

## 2021-09-04 ENCOUNTER — Encounter (HOSPITAL_BASED_OUTPATIENT_CLINIC_OR_DEPARTMENT_OTHER): Payer: Self-pay | Admitting: Cardiology

## 2021-09-04 VITALS — BP 117/70 | HR 86 | Ht 64.0 in | Wt 200.6 lb

## 2021-09-04 DIAGNOSIS — Z7189 Other specified counseling: Secondary | ICD-10-CM | POA: Diagnosis not present

## 2021-09-04 DIAGNOSIS — Z713 Dietary counseling and surveillance: Secondary | ICD-10-CM | POA: Diagnosis not present

## 2021-09-04 DIAGNOSIS — R0789 Other chest pain: Secondary | ICD-10-CM

## 2021-09-04 DIAGNOSIS — Z7182 Exercise counseling: Secondary | ICD-10-CM | POA: Diagnosis not present

## 2021-09-04 DIAGNOSIS — E669 Obesity, unspecified: Secondary | ICD-10-CM

## 2021-09-04 DIAGNOSIS — K760 Fatty (change of) liver, not elsewhere classified: Secondary | ICD-10-CM

## 2021-09-04 NOTE — Patient Instructions (Signed)
Medication Instructions:  Your physician recommends that you continue on your current medications as directed. Please refer to the Current Medication list given to you today.   *If you need a refill on your cardiac medications before your next appointment, please call your pharmacy*  Lab Work: NONE  Testing/Procedures: NONE  Follow-Up: At [facility], you and your health needs are our priority.  As part of our continuing mission to provide you with exceptional heart care, we have created designated Provider Care Teams.  These Care Teams include your primary Cardiologist (physician) and Advanced Practice Providers (APPs -  Physician Assistants and Nurse Practitioners) who all work together to provide you with the care you need, when you need it.  We recommend signing up for the patient portal called "MyChart".  Sign up information is provided on this After Visit Summary.  MyChart is used to connect with patients for Virtual Visits (Telemedicine).  Patients are able to view lab/test results, encounter notes, upcoming appointments, etc.  Non-urgent messages can be sent to your provider as well.   To learn more about what you can do with MyChart, go to [email or web address].    Your next appointment:   12 month(s)  The format for your next appointment:   In Person  Provider:   Bridgette Christopher, MD     

## 2021-09-15 NOTE — Telephone Encounter (Signed)
Called pt to f/u on how she was doing with [facility]. She took her last dose of 0.5mg  this week. Lost 7lb. Appetite down. No nausea. Avoiding processed foods. Still tracking her food in app. Not as much exercise as she has been very busy traveling for work. We talked about exercising first thing in AM.  ?I will call pt in 4 week to f/u. ? ?

## 2021-10-05 ENCOUNTER — Other Ambulatory Visit: Payer: Self-pay | Admitting: Cardiology

## 2021-10-05 ENCOUNTER — Encounter (HOSPITAL_BASED_OUTPATIENT_CLINIC_OR_DEPARTMENT_OTHER): Payer: Self-pay | Admitting: Cardiology

## 2021-10-06 ENCOUNTER — Telehealth: Payer: Self-pay | Admitting: Pharmacist

## 2021-10-06 MED ORDER — WEGOVY 1.7 MG/0.75ML ~~LOC~~ SOAJ: 1.7000 mg | SUBCUTANEOUS | 0 refills | Status: DC

## 2021-10-06 MED ORDER — WEGOVY 2.4 MG/0.75ML ~~LOC~~ SOAJ: 2.4000 mg | SUBCUTANEOUS | 11 refills | Status: DC

## 2021-10-06 NOTE — Telephone Encounter (Signed)
Patient is ready to increase to 1.7mg . Needs Rx sent. Requested that I send both the 1.7 and 2.4. Rx sent. Pt to f/u with me 5/1 in clinic. ?

## 2021-10-19 ENCOUNTER — Ambulatory Visit (INDEPENDENT_AMBULATORY_CARE_PROVIDER_SITE_OTHER): Payer: BC Managed Care – PPO | Admitting: Pharmacist

## 2021-10-19 VITALS — Wt 190.4 lb

## 2021-10-19 DIAGNOSIS — E669 Obesity, unspecified: Secondary | ICD-10-CM

## 2021-10-19 DIAGNOSIS — R079 Chest pain, unspecified: Secondary | ICD-10-CM | POA: Diagnosis not present

## 2021-10-19 DIAGNOSIS — K760 Fatty (change of) liver, not elsewhere classified: Secondary | ICD-10-CM

## 2021-10-19 NOTE — Patient Instructions (Signed)
Start Wegovy 1.7mg  weekly today ? ?Try Sharol Roussel ? ?Keep up the great work ?Continue exercising and try to increase- try adding some resistance training   ?

## 2021-10-19 NOTE — Progress Notes (Signed)
Patient ID: Vanessa Landry                 DOB: 1966/01/23                    MRN: [id number] ? ? ? ? ?HPI: ?Vanessa Landry is a 56 y.o. female patient referred to pharmacy clinic by Dr. Harrell Gave to initiate weight loss therapy with GLP1-RA. PMH is significant for obesity complicated by chronic medical conditions including interstitial cystitis, anxiety, atypical chest pain and fatty liver . Most recent BMI 35.57. ? ?She was started on Wegovy at last visit. She presents today for follow up. She has been doing a great job with sticking with her cleaned up diet. She is using [employer] powder if she cant get in her vegetables and Pure protein. She wants to know what is in her food and pays close attention to labels. Makes her own mayo. She has been walking at least 10 min on treadmill daily in the AM. Does still have reflux. Better if she remembers to eat slowly. Due to increase to 1.7mg  weekly today. No missed doses. Has lost 17 lb. 8% of body weight. 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  Does state that she is tired often. Thinks it could be part allergies. Taking a plant based iodine which helps. ? ?Current weight management medications: Wegovy ? ?Previously tried meds: pheniramine, Lyn Henri MD- testosterone pellets ? ?Current meds that may affect weight: none ? ?Baseline weight/BMI: 207lb/35.57 ? ?Insurance payor: [employer] ? ?Diet:  ?-Breakfast: black coffee ?-Lunch: fruit, peanut butter, pecans, jerky, yes bars ?-Dinner: protein and vegetable or salad, 1-2 times a week potatoes ?-Snacks: ?-Drinks: coffee and water ? ?Exercise: walks 10 min on treadmill in the AM, tries to walk on treadmill about 1,000 steps in the AM, will sometime stop when she is on the road at a church parking lot and walk the parking lot ? ?Family History: Father had AAA and had graft, has HTN, HLD, and diabetes. Well controlled, now age 109. Mother has glaucoma and has always had low blood pressure. Brother  generally healthy. No sudden cardiac death. Had an aunt die of a brain aneursym at age 60 (father's side). Teena Irani had heart attack and passed away in her 63s. No CVA. ? ?Social History: former smoker, vaping, doesn't drink ? ?Labs: ?Lab Results  ?Component Value Date  ? HGBA1C 6.2 (H) 07/29/2021  ? ? ?Wt Readings from Last 1 Encounters:  ?09/04/21 200 lb 9.6 oz (91 kg)  ? ? ?BP Readings from Last 1 Encounters:  ?09/04/21 117/70  ? ?Pulse Readings from Last 1 Encounters:  ?09/04/21 86  ? ? ?   ?Component Value Date/Time  ? CHOL 250 (H) 07/29/2021 1026  ? TRIG 138 07/29/2021 1026  ? HDL 59 07/29/2021 1026  ? CHOLHDL 4.2 07/29/2021 1026  ? LDLCALC 166 (H) 07/29/2021 1026  ? ? ?Past Medical History:  ?Diagnosis Date  ? Interstitial cystitis   ? ? ?Current Outpatient Medications on File Prior to Visit  ?Medication Sig Dispense Refill  ? betamethasone dipropionate 0.05 % cream Apply topically 2 (two) times daily as needed.    ? Cetirizine HCl 10 MG TBDP     ? Menatetrenone (VITAMIN K2) 100 MCG TABS     ? Vitamin E 450 MG (1000 UT) CAPS     ? WEGOVY 1.7 MG/0.75ML SOAJ Inject 1.7 mg into the skin once a week.  3 mL 0  ? WEGOVY 2.4 MG/0.75ML SOAJ Inject 2.4 mg into the skin once a week. 3 mL 11  ? ?No current facility-administered medications on file prior to visit.  ? ? ?Allergies  ?Allergen Reactions  ? Morphine Nausea And Vomiting  ? Tetracycline Nausea And Vomiting  ? Latex Hives and Rash  ? ? ? ?Assessment/Plan: ? ?1. Weight loss - Patient has lost 8% of her body weight. Will increase to [facility] 1.7mg  today. We reviewed her diet. Encouraged her to increase exercise and try to add some strength training. She has interstitial cystitis and gets UTI frequently. The Abx give her yeast infections, has to take diflucan. Both give her diarrhea. I recommended she try drinking Keifer and adding other fermented foods to her diet to help the gut microbiome recover. Follow up in 3 months in person. I advised that if her side effects  worsen on the 1.7 (after about 2 weeks) to call me. ? ?Thank you, ? ?Ramond Dial, Pharm.D, BCPS, CPP ?[id number], [place], [place] [address]  ?Phone: [phone]; Fax: [phone]  ? ?

## 2021-12-08 ENCOUNTER — Encounter: Payer: Self-pay | Admitting: Pharmacist

## 2021-12-09 ENCOUNTER — Other Ambulatory Visit (HOSPITAL_BASED_OUTPATIENT_CLINIC_OR_DEPARTMENT_OTHER): Payer: Self-pay

## 2021-12-09 MED ORDER — WEGOVY 2.4 MG/0.75ML ~~LOC~~ SOAJ
2.4000 mg | SUBCUTANEOUS | 11 refills | Status: DC
  Filled 2021-12-09: qty 3, 28d supply, fill #0
  Filled 2021-12-18: qty 3, 28d supply, fill #1

## 2021-12-18 ENCOUNTER — Other Ambulatory Visit (HOSPITAL_BASED_OUTPATIENT_CLINIC_OR_DEPARTMENT_OTHER): Payer: Self-pay

## 2022-01-11 ENCOUNTER — Ambulatory Visit (INDEPENDENT_AMBULATORY_CARE_PROVIDER_SITE_OTHER): Payer: BC Managed Care – PPO | Admitting: Pharmacist

## 2022-01-11 ENCOUNTER — Other Ambulatory Visit (HOSPITAL_BASED_OUTPATIENT_CLINIC_OR_DEPARTMENT_OTHER): Payer: Self-pay

## 2022-01-11 VITALS — Wt 177.8 lb

## 2022-01-11 DIAGNOSIS — E669 Obesity, unspecified: Secondary | ICD-10-CM | POA: Diagnosis not present

## 2022-01-11 DIAGNOSIS — K760 Fatty (change of) liver, not elsewhere classified: Secondary | ICD-10-CM | POA: Diagnosis not present

## 2022-01-11 LAB — LIPID PANEL
Chol/HDL Ratio: 3.7 ratio (ref 0.0–4.4)
Cholesterol, Total: 213 mg/dL — ABNORMAL HIGH (ref 100–199)
HDL: 57 mg/dL (ref >39)
LDL Chol Calc (NIH): 128 mg/dL — ABNORMAL HIGH (ref 0–99)
Triglycerides: 157 mg/dL — ABNORMAL HIGH (ref 0–149)
VLDL Cholesterol Cal: 28 mg/dL (ref 5–40)

## 2022-01-11 LAB — HEMOGLOBIN A1C
Est. average glucose Bld gHb Est-mCnc: 108 mg/dL
Hgb A1c MFr Bld: 5.4 % (ref 4.8–5.6)

## 2022-01-11 MED ORDER — WEGOVY 1.7 MG/0.75ML ~~LOC~~ SOAJ
1.7000 mg | SUBCUTANEOUS | 11 refills | Status: DC
  Filled 2022-01-11 – 2022-01-26 (×2): qty 3, 28d supply, fill #0
  Filled 2022-02-18: qty 3, 28d supply, fill #1
  Filled 2022-04-14: qty 3, 28d supply, fill #2
  Filled 2022-05-07 – 2022-05-11 (×2): qty 3, 28d supply, fill #3
  Filled 2022-06-18: qty 3, 28d supply, fill #4

## 2022-01-11 NOTE — Patient Instructions (Signed)
Try decreasing down to [facility] 1.7mg  weekly Please call me with any issues or if your side effects don't improve [phone]

## 2022-01-11 NOTE — Progress Notes (Signed)
Patient ID: Vanessa Landry                 DOB: 06/25/1965                    MRN: [id number]     HPI: Vanessa Landry is a 56 y.o. female patient referred to pharmacy clinic by Dr. Cristal Deer to initiate weight loss therapy with GLP1-RA. PMH is significant for obesity complicated by chronic medical conditions including interstitial cystitis, anxiety, atypical chest pain and fatty liver . Most recent BMI 30.5.  She was started on Wegovy in Feb 2023. She presents today for her 6 month follow up. She states that she has been feeling more fatigued since starting the 2.4mg  dose. She also states her eyes are dry, her body aches and she has this odd tingling sensation under her skin. She has been staying active walking 6000 steps a day and stretching. She is eating whole food as much a possible. Some constipation, but was relieved with miralax. She started eating Kefir and it has helped with her interstitial cystitis. She eats a small amount of food every 2-3 hours. Has lost 29 lb. 14% of body weight. 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  Does state that she is tired often. Thinks it could be part allergies. Taking a plant based iodine which helps.  Current weight management medications: Wegovy 2.4mg   Previously tried meds: pheniramine, [place] MD- testosterone pellets  Current meds that may affect weight: none  Baseline weight/BMI: 207lb/35.57  Insurance payor: [employer]  Diet:  -Breakfast: black coffee, kifer w/ spinach and berry smoothie -Lunch: fruit, peanut butter, pecans, jerky, yes bars, hard boil egg -Dinner: protein and vegetable or salad, 1-2 times a week potatoes -Snacks: -Drinks: coffee and water  Exercise: walks 10 min on treadmill in the AM, tries to walk on treadmill about 1,000 steps in the AM, will sometime stop when she is on the road at a church parking lot and walk the parking lot  Family History: Father had AAA and had graft, has HTN, HLD, and  diabetes. Well controlled, now age 60. Mother has glaucoma and has always had low blood pressure. Brother generally healthy. No sudden cardiac death. Had an aunt die of a brain aneursym at age 44 (father's side). Griselda Miner had heart attack and passed away in her 36s. No CVA.  Social History: former smoker, vaping, doesn't drink  Labs: Lab Results  Component Value Date   HGBA1C 6.2 (H) 07/29/2021    Wt Readings from Last 1 Encounters:  10/19/21 190 lb 6.4 oz (86.4 kg)    BP Readings from Last 1 Encounters:  09/04/21 117/70   Pulse Readings from Last 1 Encounters:  09/04/21 86       Component Value Date/Time   CHOL 250 (H) 07/29/2021 1026   TRIG 138 07/29/2021 1026   HDL 59 07/29/2021 1026   CHOLHDL 4.2 07/29/2021 1026   LDLCALC 166 (H) 07/29/2021 1026    Past Medical History:  Diagnosis Date   Interstitial cystitis     Current Outpatient Medications on File Prior to Visit  Medication Sig Dispense Refill   betamethasone dipropionate 0.05 % cream Apply topically 2 (two) times daily as needed.     Cetirizine HCl 10 MG TBDP      Menatetrenone (VITAMIN K2) 100 MCG TABS      Vitamin E 450 MG (1000 UT) CAPS      WEGOVY  2.4 MG/0.75ML SOAJ Inject 2.4 mg into the skin once a week. 3 mL 11   No current facility-administered medications on file prior to visit.    Allergies  Allergen Reactions   Morphine Nausea And Vomiting   Tetracycline Nausea And Vomiting   Latex Hives and Rash     Assessment/Plan:  1. Weight loss - Patient has lost 14% of her body weight. Will try decreasing to [facility] 1.7mg  to see if there is improvement with fatigue. We reviewed her diet. Encouraged her to add some strength training. Checking A1C and lipids today. Follow up as needed.  Thank you,  Olene Floss, Pharm.D, BCPS, CPP  [facility]  [address] [place], [place] [address]  Phone: [phone]; Fax: [phone]

## 2022-01-25 ENCOUNTER — Other Ambulatory Visit (HOSPITAL_BASED_OUTPATIENT_CLINIC_OR_DEPARTMENT_OTHER): Payer: Self-pay

## 2022-01-26 ENCOUNTER — Other Ambulatory Visit (HOSPITAL_BASED_OUTPATIENT_CLINIC_OR_DEPARTMENT_OTHER): Payer: Self-pay

## 2022-02-18 ENCOUNTER — Encounter: Payer: Self-pay | Admitting: Pharmacist

## 2022-02-18 ENCOUNTER — Other Ambulatory Visit (HOSPITAL_BASED_OUTPATIENT_CLINIC_OR_DEPARTMENT_OTHER): Payer: Self-pay

## 2022-04-15 ENCOUNTER — Encounter: Payer: Self-pay | Admitting: Pharmacist

## 2022-04-16 ENCOUNTER — Other Ambulatory Visit (HOSPITAL_BASED_OUTPATIENT_CLINIC_OR_DEPARTMENT_OTHER): Payer: Self-pay

## 2022-04-16 ENCOUNTER — Telehealth: Payer: Self-pay | Admitting: Pharmacist

## 2022-04-16 NOTE — Telephone Encounter (Signed)
PA renewal needed for [facility]. Key: [id number] Submitted via CMM

## 2022-04-19 ENCOUNTER — Other Ambulatory Visit (HOSPITAL_BASED_OUTPATIENT_CLINIC_OR_DEPARTMENT_OTHER): Payer: Self-pay

## 2022-04-20 ENCOUNTER — Other Ambulatory Visit (HOSPITAL_BASED_OUTPATIENT_CLINIC_OR_DEPARTMENT_OTHER): Payer: Self-pay

## 2022-05-07 ENCOUNTER — Other Ambulatory Visit (HOSPITAL_BASED_OUTPATIENT_CLINIC_OR_DEPARTMENT_OTHER): Payer: Self-pay

## 2022-05-11 ENCOUNTER — Other Ambulatory Visit (HOSPITAL_BASED_OUTPATIENT_CLINIC_OR_DEPARTMENT_OTHER): Payer: Self-pay

## 2022-06-18 ENCOUNTER — Other Ambulatory Visit (HOSPITAL_BASED_OUTPATIENT_CLINIC_OR_DEPARTMENT_OTHER): Payer: Self-pay

## 2022-06-23 ENCOUNTER — Other Ambulatory Visit: Payer: Self-pay | Admitting: Cardiology

## 2022-06-23 ENCOUNTER — Encounter: Payer: Self-pay | Admitting: Pharmacist

## 2022-06-23 MED ORDER — WEGOVY 1.7 MG/0.75ML ~~LOC~~ SOAJ: 1.7000 mg | SUBCUTANEOUS | 11 refills | Status: DC

## 2022-06-30 ENCOUNTER — Other Ambulatory Visit (HOSPITAL_BASED_OUTPATIENT_CLINIC_OR_DEPARTMENT_OTHER): Payer: Self-pay

## 2022-10-21 ENCOUNTER — Ambulatory Visit (INDEPENDENT_AMBULATORY_CARE_PROVIDER_SITE_OTHER): Payer: BC Managed Care – PPO | Admitting: Cardiology

## 2022-10-21 ENCOUNTER — Encounter (HOSPITAL_BASED_OUTPATIENT_CLINIC_OR_DEPARTMENT_OTHER): Payer: Self-pay | Admitting: Cardiology

## 2022-10-21 VITALS — BP 114/72 | HR 70 | Ht 64.0 in | Wt 168.2 lb

## 2022-10-21 DIAGNOSIS — Z7189 Other specified counseling: Secondary | ICD-10-CM

## 2022-10-21 DIAGNOSIS — T887XXA Unspecified adverse effect of drug or medicament, initial encounter: Secondary | ICD-10-CM

## 2022-10-21 DIAGNOSIS — Z7182 Exercise counseling: Secondary | ICD-10-CM | POA: Diagnosis not present

## 2022-10-21 DIAGNOSIS — Z713 Dietary counseling and surveillance: Secondary | ICD-10-CM | POA: Diagnosis not present

## 2022-10-21 NOTE — Patient Instructions (Signed)
Medication Instructions:  The current medical regimen is effective;  continue present plan and medications.  *If you need a refill on your cardiac medications before your next appointment, please call your pharmacy*   Lab Work: None  Testing/Procedures: None   Follow-Up: At [facility], you and your health needs are our priority.  As part of our continuing mission to provide you with exceptional heart care, we have created designated Provider Care Teams.  These Care Teams include your primary Cardiologist (physician) and Advanced Practice Providers (APPs -  Physician Assistants and Nurse Practitioners) who all work together to provide you with the care you need, when you need it.  We recommend signing up for the patient portal called "MyChart".  Sign up information is provided on this After Visit Summary.  MyChart is used to connect with patients for Virtual Visits (Telemedicine).  Patients are able to view lab/test results, encounter notes, upcoming appointments, etc.  Non-urgent messages can be sent to your provider as well.   To learn more about what you can do with MyChart, go to [email or web address].    Your next appointment:   1 year(s)  Provider:   Bridgette Christopher, MD    Other Instructions None  

## 2022-10-21 NOTE — Progress Notes (Signed)
Cardiology Office Note:    Date:  10/21/2022   ID:  Vanessa Landry, DOB 07-Aug-1965, MRN [id number]  PCP:  Pcp, No  Cardiologist:  Jodelle Red, MD  Referring MD: No ref. provider found   CC: follow up  History of Present Illness:    Vanessa Landry is a 57 y.o. female with with a hx of NASH, prior obesity who is seen in follow up for evaluation of chest pain. She was seen in the ER on 12/05/17 for these symptoms, and her initial consult was on 12/30/17.   She was seen in the ED at [facility] 07/11/2021 for sudden onset of left sided abdominal/rib cage pain after waking up in the morning. She was feeling better on Toradol, and she was discharged with Robaxin and a limited number of Norco for likely musculoskeletal pain.  Today, she reports that she stopped the [facility] about 2.5 months now. She had difficulty with getting it refilled and she had significant side effects. At night, if she moved her eyes to the side she noticed large white streaks in her vision. She has also been using stronger reader glasses. Lately her eyesight has been gradually improving off the [facility]. Also she had some dysphagia not resolved with water, sometimes causing emesis. Additionally her hair was falling out "by the handfuls". More recently her hair loss has improved and her energy levels are increasing.  Her main dietary struggle has been avoiding overeating. She has significantly increased hunger. She is focusing on smaller portions, and eating at least 100 g of protein daily. Not craving sugar, but when she does eat sugary foods she develops instant hot flashes. She has noticed that her hot flashes seem to be returning since stopping Wegovy. No hot flashes after eating apples, oranges, or dates. She has replaced her fish oil supplement with castor oil.  Additionally she complains of some palpitations. Her EKG today does show a PVC.  At times she experiences some sharp chest pains and aching in her shoulders. This is  relieved with taking off her bra. She believes it may be a GI issue.   She denies any shortness of breath, or peripheral edema. No lightheadedness, headaches, syncope, orthopnea, or PND.   Past Medical History:  Diagnosis Date   Interstitial cystitis    History reviewed. No pertinent surgical history.   Current Meds  Medication Sig   betamethasone dipropionate 0.05 % cream Apply topically 2 (two) times daily as needed.   Menatetrenone (VITAMIN K2) 100 MCG TABS    Vitamin E 450 MG (1000 UT) CAPS      Allergies:   Morphine, Tetracycline, and Latex   Social History   Tobacco Use   Smoking status: Former    Types: Cigarettes   Smokeless tobacco: Former  Building services engineer Use: Every day     Family Hx: Father had AAA and had graft, has HTN, HLD, and diabetes. Well controlled, now age 81. Mother has glaucoma and has always had low blood pressure. Brother generally healthy. No sudden cardiac death. Had an aunt die of a brain aneursym at age 39 (father's side). Griselda Miner had heart attack and passed away in her 59s. No CVA.  ROS:   Please see the history of present illness.    (+) Increased appetite (+) Sharp chest pains (+) Aching pains of bilateral shoulders All other systems reviewed and are negative.   Prior CV studies:   The following studies were reviewed today:  CTA Pulmonary 07/11/2021 ([facility]):  FINDINGS: No pulmonary embolus or aortic dissection. Severe fatty infiltration of the liver. No adenopathy. No pleural or pericardial effusion. Lungs are clear.  ETT 01/03/18 Blood pressure demonstrated a normal response to exercise. There was no ST segment deviation noted during stress.   ETT with fair exercise tolerance (6:34); no chest pain; normal BP response; no ST changes; negative adequate ETT; [facility] treadmill score 6.   Care Everywhere  cath report from 01/29/2015: 1. Hemodynamics: Aortic pressure 118/63, LVEDP 16      2. Coronary system:  Left Main: Large caliber  vessel with no angiographic evidence of stenosis.     LAD system: Large caliber vessel, wraps around the apex. No stenosis     LCX system: Left dominant, large caliber vessel.  No stenosis.     RCA system: right none dominant. No stenosis.      Conclusion:   No CAD as mentioned above.   Mildly elevated LVEDP   Preserved LV function with normal wall motion     Echo report from 01/29/2015 Interpretation Summary   A complete portable two-dimensional transthoracic echocardiogram was   performed.   The left ventricle is normal in size, wall thickness and wall motion with   ejection fraction of 55-60%.   The left ventricular diastolic function is normal.   There is mild-moderate (1-2+) mitral regurgitation.   The aortic valve is not well visualized, but is grossly normal.    Labs/Other Tests and Data Reviewed:    EKG:  EKG is personally reviewed. 10/21/2022:  SR with PAC at 70 bpm 09/04/2021: SR with sinus arrhythmia at 86 bpm 09/03/2020: NSR  Recent Labs: No results found for requested labs within last 365 days.   Recent Lipid Panel Lab Results  Component Value Date/Time   CHOL 213 (H) 01/11/2022 10:15 AM   TRIG 157 (H) 01/11/2022 10:15 AM   HDL 57 01/11/2022 10:15 AM   CHOLHDL 3.7 01/11/2022 10:15 AM   LDLCALC 128 (H) 01/11/2022 10:15 AM    Wt Readings from Last 3 Encounters:  10/21/22 168 lb 3.2 oz (76.3 kg)  01/11/22 177 lb 12.8 oz (80.6 kg)  10/19/21 190 lb 6.4 oz (86.4 kg)     Objective:    Vital Signs:  BP 114/72   Pulse 70   Ht 5\' 4"  (1.626 m)   Wt 168 lb 3.2 oz (76.3 kg)   SpO2 97%   BMI 28.87 kg/m    GEN: Well nourished, well developed in no acute distress HEENT: Normal, moist mucous membranes NECK: No JVD CARDIAC: regular rhythm, normal S1 and S2, no rubs or gallops. No murmur. VASCULAR: Radial and DP pulses 2+ bilaterally. No carotid bruits RESPIRATORY:  Clear to auscultation without rales, wheezing or rhonchi  ABDOMEN: Soft, non-tender,  non-distended MUSCULOSKELETAL:  Ambulates independently SKIN: Warm and dry, no edema NEUROLOGIC:  Alert and oriented x 3. No focal neuro deficits noted. PSYCHIATRIC:  Normal affect   ASSESSMENT & PLAN:    1. Counseling on health promotion and disease prevention   2. Cardiac risk counseling   3. Weight loss counseling, encounter for   4. Nutritional counseling   5. Exercise counseling   6. Side effect of medication     Chest pain, atypical: -ETT negative adequate.  -no further cardiac workup at this time -ECG unremarkable -counseled on red flag warning signs that need immediate medical attention   Prevention, CV risk counseling Prior obesity, BMI now improved at 28 Fatty liver -had side effects on wegovy, now  off of meds -she has worked hard to maintain weight loss with diet and exercise. Discussed at length today -recommend heart healthy/Mediterranean diet, with whole grains, fruits, vegetable, fish, lean meats, nuts, and olive oil. Limit salt. -recommend moderate walking, 3-5 times/week for 30-50 minutes each session. Aim for at least 150 minutes.week. Goal should be pace of 3 miles/hours, or walking 1.5 miles in 30 minutes -recommend avoidance of tobacco products. Avoid excess alcohol.  Plan for follow-up:   1 year or sooner as needed, per patient preference, to monitor risk factors  Medication Adjustments/Labs and Tests Ordered: Current medicines are reviewed at length with the patient today.  Concerns regarding medicines are outlined above.   Orders Placed This Encounter  Procedures   EKG 12-Lead   No orders of the defined types were placed in this encounter.  Patient Instructions  Medication Instructions:  The current medical regimen is effective;  continue present plan and medications.   *If you need a refill on your cardiac medications before your next appointment, please call your pharmacy*   Lab Work: None   Testing/Procedures: None   Follow-Up: At  [facility], you and your health needs are our priority.  As part of our continuing mission to provide you with exceptional heart care, we have created designated Provider Care Teams.  These Care Teams include your primary Cardiologist (physician) and Advanced Practice Providers (APPs -  Physician Assistants and Nurse Practitioners) who all work together to provide you with the care you need, when you need it.  We recommend signing up for the patient portal called "MyChart".  Sign up information is provided on this After Visit Summary.  MyChart is used to connect with patients for Virtual Visits (Telemedicine).  Patients are able to view lab/test results, encounter notes, upcoming appointments, etc.  Non-urgent messages can be sent to your provider as well.   To learn more about what you can do with MyChart, go to [email or web address].    Your next appointment:   1 year(s)  Provider:   Jodelle Red, MD    Other Instructions None     I,Mathew Stumpf,acting as a scribe for Jodelle Red, MD.,have documented all relevant documentation on the behalf of Jodelle Red, MD,as directed by  Jodelle Red, MD while in the presence of Jodelle Red, MD.  I, Jodelle Red, MD, have reviewed all documentation for this visit. The documentation on 10/21/22 for the exam, diagnosis, procedures, and orders are all accurate and complete.   Signed, Jodelle Red, MD  10/21/2022     [facility]

## 2022-10-27 ENCOUNTER — Encounter (HOSPITAL_BASED_OUTPATIENT_CLINIC_OR_DEPARTMENT_OTHER): Payer: Self-pay

## 2022-10-27 DIAGNOSIS — Z7189 Other specified counseling: Secondary | ICD-10-CM

## 2022-10-27 DIAGNOSIS — E669 Obesity, unspecified: Secondary | ICD-10-CM

## 2022-10-27 NOTE — Telephone Encounter (Signed)
What labs would you like to run on this patient?

## 2022-11-21 NOTE — Telephone Encounter (Signed)
Okay to order CMP, A1c, lipid, CBC under my name. Needs to collect 12/2022 as that will be when she is one year from prior labs. Would also recommend she establish with PCP.   Alver Sorrow, NP

## 2023-08-11 ENCOUNTER — Telehealth (HOSPITAL_BASED_OUTPATIENT_CLINIC_OR_DEPARTMENT_OTHER): Payer: Self-pay | Admitting: Cardiology

## 2023-08-11 DIAGNOSIS — E669 Obesity, unspecified: Secondary | ICD-10-CM

## 2023-08-11 DIAGNOSIS — K760 Fatty (change of) liver, not elsewhere classified: Secondary | ICD-10-CM

## 2023-08-11 NOTE — Telephone Encounter (Signed)
Fasting lipid panel, CMET, CBC, TSH, A1c please. Can associated with diagnoses: obesity, NAFLD.  Alver Sorrow, NP

## 2023-08-11 NOTE — Telephone Encounter (Signed)
Patient is requesting that his lab orders be mailed to him so that he can get them done before his next appointment

## 2023-08-11 NOTE — Telephone Encounter (Signed)
Labs entered and mailed to pt.

## 2023-10-25 ENCOUNTER — Ambulatory Visit (HOSPITAL_BASED_OUTPATIENT_CLINIC_OR_DEPARTMENT_OTHER): Payer: BC Managed Care – PPO | Admitting: Family

## 2024-02-07 NOTE — Progress Notes (Signed)
 Cardiology Office Note:  .   Date:  02/08/2024  ID:  Vanessa Landry, DOB 06/29/1965, MRN [id number] PCP: Freddrick Johns  [facility] Providers Cardiologist:  Shelda Bruckner, MD {  History of Present Illness: .   Vanessa Landry is a 58 y.o. female with a hx of NASH, prior obesity who is seen in follow up. She was seen in the ER on 12/05/17 for these symptoms, and her initial consult was on 12/30/17   Pertinent CV history: Cath 2016 without CAD. Normal ETT 2019.   Today: Has been busy/stressed, finally took some time off last week and feels rejuvenated. Chest pain has been much improved, knows relationship between stress/anxiety and how it drives her nervous system to be hypersensitive. Has been maximizing sleep hygiene, managing stress.  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 Eating much more protein, feels much better with this. Was back over 200 lbs, now down 25 lbs again on her home scale. Discussed goals for activity, both cardiovascular and weight bearing.  ROS: Denies recent chest pain, shortness of breath at rest or with normal exertion. No PND, orthopnea, LE edema or unexpected weight gain. No syncope or palpitations. ROS otherwise negative except as noted.   Studies Reviewed: SABRA    EKG:  EKG Interpretation Date/Time:  Wednesday February 08 2024 16:09:33 EDT Ventricular Rate:  78 PR Interval:  138 QRS Duration:  96 QT Interval:  388 QTC Calculation: 442 R Axis:   13  Text Interpretation: Normal sinus rhythm Normal ECG When compared with ECG of 05-Dec-2017 11:12, No significant change was found Confirmed by Bruckner Shelda [phone]) on 02/08/2024 4:49:46 PM    Physical Exam:   VS:  BP (!) 124/90 (BP Location: Left Arm, Patient Position: Sitting, Cuff Size: Normal)   Pulse 78   Ht 5' 4 (1.626 m)   Wt 178 lb 9.6 oz (81 kg)   SpO2 96%   BMI 30.66 kg/m    Wt Readings from Last 3 Encounters:  02/08/24 178 lb 9.6 oz (81 kg)  10/21/22 168 lb 3.2 oz (76.3 kg)   01/11/22 177 lb 12.8 oz (80.6 kg)    GEN: Well nourished, well developed in no acute distress HEENT: Normal, moist mucous membranes NECK: No JVD CARDIAC: regular rhythm, normal S1 and S2, no rubs or gallops. No murmur appreciated today. VASCULAR: Radial and DP pulses 2+ bilaterally. No carotid bruits RESPIRATORY:  Clear to auscultation without rales, wheezing or rhonchi  ABDOMEN: Soft, non-tender, non-distended MUSCULOSKELETAL:  Ambulates independently SKIN: Warm and dry, no edema NEUROLOGIC:  Alert and oriented x 3. No focal neuro deficits noted. PSYCHIATRIC:  Normal affect    ASSESSMENT AND PLAN: .    Chest pain, atypical: no recent events, reviewed likely etiologies -ETT negative adequate.  -ECG unremarkable -counseled on red flag warning signs that need immediate medical attention   Prevention, CV risk counseling Prior obesity, BMI now improved at 30 Fatty liver -had side effects on wegovy , now off of meds, did gain some weight back but recommitted to lifestyle change -she has worked hard to maintain weight loss with diet and exercise. Discussed at length today -recommend heart healthy/Mediterranean diet, with whole grains, fruits, vegetable, fish, lean meats, nuts, and olive oil. Limit salt. -recommend moderate walking, 3-5 times/week for 30-50 minutes each session. Aim for at least 150 minutes.week. Goal should be pace of 3 miles/hours, or walking 1.5 miles in 30 minutes -recommend avoidance of tobacco products. Avoid excess alcohol.  Dispo: 1 year or sooner, per  patient preference, to monitor CV risk  Signed, Shelda Bruckner, MD   Shelda Bruckner, MD, PhD, [facility]  [facility]  [place]  [facility] at [facility] at [facility] [address] [place], [place] [address] [phone]

## 2024-02-08 ENCOUNTER — Ambulatory Visit (INDEPENDENT_AMBULATORY_CARE_PROVIDER_SITE_OTHER): Admitting: Cardiology

## 2024-02-08 ENCOUNTER — Encounter (HOSPITAL_BASED_OUTPATIENT_CLINIC_OR_DEPARTMENT_OTHER): Payer: Self-pay | Admitting: Cardiology

## 2024-02-08 VITALS — BP 124/90 | HR 78 | Ht 64.0 in | Wt 178.6 lb

## 2024-02-08 DIAGNOSIS — K760 Fatty (change of) liver, not elsewhere classified: Secondary | ICD-10-CM

## 2024-02-08 DIAGNOSIS — R0789 Other chest pain: Secondary | ICD-10-CM

## 2024-02-08 DIAGNOSIS — E669 Obesity, unspecified: Secondary | ICD-10-CM

## 2024-02-08 DIAGNOSIS — Z131 Encounter for screening for diabetes mellitus: Secondary | ICD-10-CM

## 2024-02-08 DIAGNOSIS — Z7189 Other specified counseling: Secondary | ICD-10-CM

## 2024-02-08 NOTE — Patient Instructions (Signed)
 Medication Instructions:  Your physician recommends that you continue on your current medications as directed. Please refer to the Current Medication list given to you today.  *If you need a refill on your cardiac medications before your next appointment, please call your pharmacy*  Lab Work: FASTING LP/CMET/A1C SOON   If you have labs (blood work) drawn today and your tests are completely normal, you will receive your results only by: MyChart Message (if you have MyChart) OR A paper copy in the mail If you have any lab test that is abnormal or we need to change your treatment, we will call you to review the results.  Testing/Procedures: NONE  Follow-Up: At [facility], you and your health needs are our priority.  As part of our continuing mission to provide you with exceptional heart care, our providers are all part of one team.  This team includes your primary Cardiologist (physician) and Advanced Practice Providers or APPs (Physician Assistants and Nurse Practitioners) who all work together to provide you with the care you need, when you need it.  Your next appointment:   12 month(s)  Provider:   Shelda Bruckner, MD, Rosaline Bane, NP, or Reche Finder, NP   We recommend signing up for the patient portal called MyChart.  Sign up information is provided on this After Visit Summary.  MyChart is used to connect with patients for Virtual Visits (Telemedicine).  Patients are able to view lab/test results, encounter notes, upcoming appointments, etc.  Non-urgent messages can be sent to your provider as well.   To learn more about what you can do with MyChart, go to [email or web address].
